# Patient Record
Sex: Male | Born: 1960 | Race: White | Hispanic: No | Marital: Married | Smoking: Current every day smoker
Health system: Southern US, Community
[De-identification: ages and names within clinical notes are randomized; demographics above are authoritative.]

## PROBLEM LIST (undated history)

## (undated) DIAGNOSIS — E119 Type 2 diabetes mellitus without complications: Secondary | ICD-10-CM

## (undated) DIAGNOSIS — F419 Anxiety disorder, unspecified: Secondary | ICD-10-CM

## (undated) DIAGNOSIS — G4730 Sleep apnea, unspecified: Secondary | ICD-10-CM

## (undated) DIAGNOSIS — G4700 Insomnia, unspecified: Secondary | ICD-10-CM

## (undated) DIAGNOSIS — K219 Gastro-esophageal reflux disease without esophagitis: Secondary | ICD-10-CM

## (undated) DIAGNOSIS — J449 Chronic obstructive pulmonary disease, unspecified: Secondary | ICD-10-CM

## (undated) DIAGNOSIS — I10 Essential (primary) hypertension: Secondary | ICD-10-CM

## (undated) DIAGNOSIS — E785 Hyperlipidemia, unspecified: Secondary | ICD-10-CM

## (undated) DIAGNOSIS — G894 Chronic pain syndrome: Secondary | ICD-10-CM

## (undated) DIAGNOSIS — R188 Other ascites: Secondary | ICD-10-CM

## (undated) DIAGNOSIS — I219 Acute myocardial infarction, unspecified: Secondary | ICD-10-CM

## (undated) DIAGNOSIS — N183 Chronic kidney disease, stage 3 unspecified: Secondary | ICD-10-CM

## (undated) HISTORY — DX: Chronic pain syndrome: G89.4

## (undated) HISTORY — DX: Chronic kidney disease, stage 3 unspecified: N18.30

## (undated) HISTORY — PX: BACK SURGERY: SHX140

## (undated) HISTORY — DX: Gastro-esophageal reflux disease without esophagitis: K21.9

## (undated) HISTORY — DX: Chronic obstructive pulmonary disease, unspecified: J44.9

## (undated) HISTORY — DX: Acute myocardial infarction, unspecified: I21.9

## (undated) HISTORY — DX: Anxiety disorder, unspecified: F41.9

## (undated) HISTORY — DX: Hyperlipidemia, unspecified: E78.5

## (undated) HISTORY — PX: CARDIAC CATHETERIZATION: SHX172

## (undated) HISTORY — DX: Essential (primary) hypertension: I10

## (undated) HISTORY — PX: APPENDECTOMY: SHX54

## (undated) HISTORY — PX: CHOLECYSTECTOMY: SHX55

## (undated) HISTORY — DX: Other ascites: R18.8

## (undated) HISTORY — DX: Insomnia, unspecified: G47.00

---

## 2008-11-15 ENCOUNTER — Emergency Department (HOSPITAL_COMMUNITY): Admission: EM | Admit: 2008-11-15 | Discharge: 2008-11-15 | Payer: Self-pay | Admitting: Emergency Medicine

## 2009-01-22 ENCOUNTER — Emergency Department (HOSPITAL_COMMUNITY): Admission: EM | Admit: 2009-01-22 | Discharge: 2009-01-22 | Payer: Self-pay | Admitting: Emergency Medicine

## 2019-02-19 LAB — HEMOGLOBIN A1C: Hemoglobin A1C: 8.8

## 2019-09-22 DIAGNOSIS — F1021 Alcohol dependence, in remission: Secondary | ICD-10-CM

## 2019-09-22 DIAGNOSIS — J9621 Acute and chronic respiratory failure with hypoxia: Secondary | ICD-10-CM | POA: Diagnosis not present

## 2019-09-22 DIAGNOSIS — J690 Pneumonitis due to inhalation of food and vomit: Secondary | ICD-10-CM | POA: Diagnosis not present

## 2019-09-22 DIAGNOSIS — N179 Acute kidney failure, unspecified: Secondary | ICD-10-CM | POA: Diagnosis not present

## 2019-09-22 DIAGNOSIS — I469 Cardiac arrest, cause unspecified: Secondary | ICD-10-CM | POA: Diagnosis not present

## 2019-09-23 DIAGNOSIS — J690 Pneumonitis due to inhalation of food and vomit: Secondary | ICD-10-CM

## 2019-09-23 DIAGNOSIS — I469 Cardiac arrest, cause unspecified: Secondary | ICD-10-CM

## 2019-09-23 DIAGNOSIS — J9621 Acute and chronic respiratory failure with hypoxia: Secondary | ICD-10-CM

## 2019-09-23 DIAGNOSIS — F1021 Alcohol dependence, in remission: Secondary | ICD-10-CM

## 2019-09-23 DIAGNOSIS — N179 Acute kidney failure, unspecified: Secondary | ICD-10-CM

## 2019-09-24 DIAGNOSIS — N179 Acute kidney failure, unspecified: Secondary | ICD-10-CM

## 2019-09-24 DIAGNOSIS — J690 Pneumonitis due to inhalation of food and vomit: Secondary | ICD-10-CM

## 2019-09-24 DIAGNOSIS — F1021 Alcohol dependence, in remission: Secondary | ICD-10-CM

## 2019-09-24 DIAGNOSIS — J9621 Acute and chronic respiratory failure with hypoxia: Secondary | ICD-10-CM

## 2019-09-24 DIAGNOSIS — I469 Cardiac arrest, cause unspecified: Secondary | ICD-10-CM

## 2019-09-25 DIAGNOSIS — I469 Cardiac arrest, cause unspecified: Secondary | ICD-10-CM

## 2019-09-25 DIAGNOSIS — N179 Acute kidney failure, unspecified: Secondary | ICD-10-CM | POA: Diagnosis not present

## 2019-09-25 DIAGNOSIS — J9621 Acute and chronic respiratory failure with hypoxia: Secondary | ICD-10-CM

## 2019-09-25 DIAGNOSIS — F1021 Alcohol dependence, in remission: Secondary | ICD-10-CM

## 2019-09-25 DIAGNOSIS — J690 Pneumonitis due to inhalation of food and vomit: Secondary | ICD-10-CM | POA: Diagnosis not present

## 2019-09-26 DIAGNOSIS — F1021 Alcohol dependence, in remission: Secondary | ICD-10-CM

## 2019-09-26 DIAGNOSIS — J690 Pneumonitis due to inhalation of food and vomit: Secondary | ICD-10-CM

## 2019-09-26 DIAGNOSIS — I469 Cardiac arrest, cause unspecified: Secondary | ICD-10-CM

## 2019-09-26 DIAGNOSIS — N179 Acute kidney failure, unspecified: Secondary | ICD-10-CM

## 2019-09-26 DIAGNOSIS — J9621 Acute and chronic respiratory failure with hypoxia: Secondary | ICD-10-CM

## 2019-09-27 DIAGNOSIS — N179 Acute kidney failure, unspecified: Secondary | ICD-10-CM

## 2019-09-27 DIAGNOSIS — J690 Pneumonitis due to inhalation of food and vomit: Secondary | ICD-10-CM

## 2019-09-27 DIAGNOSIS — F1021 Alcohol dependence, in remission: Secondary | ICD-10-CM

## 2019-09-27 DIAGNOSIS — I469 Cardiac arrest, cause unspecified: Secondary | ICD-10-CM

## 2019-09-27 DIAGNOSIS — J9621 Acute and chronic respiratory failure with hypoxia: Secondary | ICD-10-CM

## 2019-09-28 DIAGNOSIS — J690 Pneumonitis due to inhalation of food and vomit: Secondary | ICD-10-CM

## 2019-09-28 DIAGNOSIS — F1021 Alcohol dependence, in remission: Secondary | ICD-10-CM

## 2019-09-28 DIAGNOSIS — I469 Cardiac arrest, cause unspecified: Secondary | ICD-10-CM

## 2019-09-28 DIAGNOSIS — N179 Acute kidney failure, unspecified: Secondary | ICD-10-CM

## 2019-09-28 DIAGNOSIS — J9621 Acute and chronic respiratory failure with hypoxia: Secondary | ICD-10-CM

## 2019-10-03 DIAGNOSIS — J9621 Acute and chronic respiratory failure with hypoxia: Secondary | ICD-10-CM

## 2019-10-03 DIAGNOSIS — F1021 Alcohol dependence, in remission: Secondary | ICD-10-CM

## 2019-10-03 DIAGNOSIS — J690 Pneumonitis due to inhalation of food and vomit: Secondary | ICD-10-CM

## 2019-10-03 DIAGNOSIS — I469 Cardiac arrest, cause unspecified: Secondary | ICD-10-CM

## 2019-10-03 DIAGNOSIS — N179 Acute kidney failure, unspecified: Secondary | ICD-10-CM

## 2019-10-04 DIAGNOSIS — F1021 Alcohol dependence, in remission: Secondary | ICD-10-CM

## 2019-10-04 DIAGNOSIS — I468 Cardiac arrest due to other underlying condition: Secondary | ICD-10-CM

## 2019-10-04 DIAGNOSIS — J9621 Acute and chronic respiratory failure with hypoxia: Secondary | ICD-10-CM

## 2019-10-04 DIAGNOSIS — N179 Acute kidney failure, unspecified: Secondary | ICD-10-CM

## 2019-10-04 DIAGNOSIS — J690 Pneumonitis due to inhalation of food and vomit: Secondary | ICD-10-CM

## 2019-10-05 DIAGNOSIS — J690 Pneumonitis due to inhalation of food and vomit: Secondary | ICD-10-CM

## 2019-10-05 DIAGNOSIS — N179 Acute kidney failure, unspecified: Secondary | ICD-10-CM

## 2019-10-05 DIAGNOSIS — F1021 Alcohol dependence, in remission: Secondary | ICD-10-CM

## 2019-10-05 DIAGNOSIS — J9621 Acute and chronic respiratory failure with hypoxia: Secondary | ICD-10-CM

## 2019-10-05 DIAGNOSIS — I469 Cardiac arrest, cause unspecified: Secondary | ICD-10-CM

## 2019-10-06 DIAGNOSIS — F1021 Alcohol dependence, in remission: Secondary | ICD-10-CM

## 2019-10-06 DIAGNOSIS — N179 Acute kidney failure, unspecified: Secondary | ICD-10-CM

## 2019-10-06 DIAGNOSIS — J690 Pneumonitis due to inhalation of food and vomit: Secondary | ICD-10-CM

## 2019-10-06 DIAGNOSIS — J9621 Acute and chronic respiratory failure with hypoxia: Secondary | ICD-10-CM

## 2019-10-06 DIAGNOSIS — I469 Cardiac arrest, cause unspecified: Secondary | ICD-10-CM

## 2019-10-07 DIAGNOSIS — J9621 Acute and chronic respiratory failure with hypoxia: Secondary | ICD-10-CM

## 2019-10-07 DIAGNOSIS — I469 Cardiac arrest, cause unspecified: Secondary | ICD-10-CM

## 2019-10-07 DIAGNOSIS — F1021 Alcohol dependence, in remission: Secondary | ICD-10-CM

## 2019-10-07 DIAGNOSIS — J690 Pneumonitis due to inhalation of food and vomit: Secondary | ICD-10-CM

## 2019-10-07 DIAGNOSIS — N179 Acute kidney failure, unspecified: Secondary | ICD-10-CM

## 2019-10-08 DIAGNOSIS — I469 Cardiac arrest, cause unspecified: Secondary | ICD-10-CM

## 2019-10-08 DIAGNOSIS — J9621 Acute and chronic respiratory failure with hypoxia: Secondary | ICD-10-CM

## 2019-10-08 DIAGNOSIS — F1021 Alcohol dependence, in remission: Secondary | ICD-10-CM

## 2019-10-08 DIAGNOSIS — J690 Pneumonitis due to inhalation of food and vomit: Secondary | ICD-10-CM

## 2019-10-08 DIAGNOSIS — N179 Acute kidney failure, unspecified: Secondary | ICD-10-CM

## 2019-10-09 DIAGNOSIS — J9621 Acute and chronic respiratory failure with hypoxia: Secondary | ICD-10-CM

## 2019-10-09 DIAGNOSIS — J690 Pneumonitis due to inhalation of food and vomit: Secondary | ICD-10-CM

## 2019-10-09 DIAGNOSIS — I469 Cardiac arrest, cause unspecified: Secondary | ICD-10-CM

## 2019-10-09 DIAGNOSIS — F1021 Alcohol dependence, in remission: Secondary | ICD-10-CM

## 2019-10-09 DIAGNOSIS — N179 Acute kidney failure, unspecified: Secondary | ICD-10-CM

## 2019-10-10 DIAGNOSIS — J9621 Acute and chronic respiratory failure with hypoxia: Secondary | ICD-10-CM

## 2019-10-10 DIAGNOSIS — N179 Acute kidney failure, unspecified: Secondary | ICD-10-CM

## 2019-10-10 DIAGNOSIS — J690 Pneumonitis due to inhalation of food and vomit: Secondary | ICD-10-CM

## 2019-10-10 DIAGNOSIS — F1021 Alcohol dependence, in remission: Secondary | ICD-10-CM

## 2019-10-10 DIAGNOSIS — I469 Cardiac arrest, cause unspecified: Secondary | ICD-10-CM

## 2019-10-11 DIAGNOSIS — I469 Cardiac arrest, cause unspecified: Secondary | ICD-10-CM

## 2019-10-11 DIAGNOSIS — F1021 Alcohol dependence, in remission: Secondary | ICD-10-CM

## 2019-10-11 DIAGNOSIS — J9621 Acute and chronic respiratory failure with hypoxia: Secondary | ICD-10-CM

## 2019-10-11 DIAGNOSIS — J690 Pneumonitis due to inhalation of food and vomit: Secondary | ICD-10-CM

## 2019-10-11 DIAGNOSIS — N179 Acute kidney failure, unspecified: Secondary | ICD-10-CM

## 2019-10-12 DIAGNOSIS — N179 Acute kidney failure, unspecified: Secondary | ICD-10-CM

## 2019-10-12 DIAGNOSIS — J9621 Acute and chronic respiratory failure with hypoxia: Secondary | ICD-10-CM

## 2019-10-12 DIAGNOSIS — J690 Pneumonitis due to inhalation of food and vomit: Secondary | ICD-10-CM

## 2019-10-12 DIAGNOSIS — F1021 Alcohol dependence, in remission: Secondary | ICD-10-CM

## 2019-10-12 DIAGNOSIS — I469 Cardiac arrest, cause unspecified: Secondary | ICD-10-CM

## 2019-10-16 ENCOUNTER — Encounter: Payer: Self-pay | Admitting: "Endocrinology

## 2019-10-20 DIAGNOSIS — N179 Acute kidney failure, unspecified: Secondary | ICD-10-CM

## 2019-10-20 DIAGNOSIS — F1021 Alcohol dependence, in remission: Secondary | ICD-10-CM

## 2019-10-20 DIAGNOSIS — I469 Cardiac arrest, cause unspecified: Secondary | ICD-10-CM

## 2019-10-20 DIAGNOSIS — J690 Pneumonitis due to inhalation of food and vomit: Secondary | ICD-10-CM

## 2019-10-20 DIAGNOSIS — J9621 Acute and chronic respiratory failure with hypoxia: Secondary | ICD-10-CM

## 2019-10-21 DIAGNOSIS — I469 Cardiac arrest, cause unspecified: Secondary | ICD-10-CM | POA: Diagnosis not present

## 2019-10-21 DIAGNOSIS — N179 Acute kidney failure, unspecified: Secondary | ICD-10-CM

## 2019-10-21 DIAGNOSIS — J690 Pneumonitis due to inhalation of food and vomit: Secondary | ICD-10-CM

## 2019-10-21 DIAGNOSIS — J9621 Acute and chronic respiratory failure with hypoxia: Secondary | ICD-10-CM

## 2019-10-21 DIAGNOSIS — F1021 Alcohol dependence, in remission: Secondary | ICD-10-CM

## 2019-11-18 ENCOUNTER — Encounter: Payer: Self-pay | Admitting: Physician Assistant

## 2019-12-03 ENCOUNTER — Encounter: Payer: Self-pay | Admitting: Physician Assistant

## 2019-12-03 ENCOUNTER — Other Ambulatory Visit (INDEPENDENT_AMBULATORY_CARE_PROVIDER_SITE_OTHER): Payer: Medicare Other

## 2019-12-03 ENCOUNTER — Telehealth: Payer: Self-pay | Admitting: General Surgery

## 2019-12-03 ENCOUNTER — Ambulatory Visit (INDEPENDENT_AMBULATORY_CARE_PROVIDER_SITE_OTHER): Payer: Medicare Other | Admitting: Physician Assistant

## 2019-12-03 VITALS — BP 92/66 | HR 70 | Temp 97.8°F | Ht 69.0 in | Wt 200.0 lb

## 2019-12-03 DIAGNOSIS — R1084 Generalized abdominal pain: Secondary | ICD-10-CM

## 2019-12-03 DIAGNOSIS — I8500 Esophageal varices without bleeding: Secondary | ICD-10-CM

## 2019-12-03 DIAGNOSIS — K7031 Alcoholic cirrhosis of liver with ascites: Secondary | ICD-10-CM

## 2019-12-03 DIAGNOSIS — K766 Portal hypertension: Secondary | ICD-10-CM

## 2019-12-03 LAB — COMPREHENSIVE METABOLIC PANEL
ALT: 28 U/L (ref 0–53)
AST: 29 U/L (ref 0–37)
Albumin: 4.7 g/dL (ref 3.5–5.2)
Alkaline Phosphatase: 83 U/L (ref 39–117)
BUN: 17 mg/dL (ref 6–23)
CO2: 28 mEq/L (ref 19–32)
Calcium: 10.2 mg/dL (ref 8.4–10.5)
Chloride: 94 mEq/L — ABNORMAL LOW (ref 96–112)
Creatinine, Ser: 1.39 mg/dL (ref 0.40–1.50)
GFR: 52.29 mL/min — ABNORMAL LOW (ref >60.00)
Glucose, Bld: 427 mg/dL — ABNORMAL HIGH (ref 70–99)
Potassium: 3.9 mEq/L (ref 3.5–5.1)
Sodium: 133 mEq/L — ABNORMAL LOW (ref 135–145)
Total Bilirubin: 1.2 mg/dL (ref 0.2–1.2)
Total Protein: 8.5 g/dL — ABNORMAL HIGH (ref 6.0–8.3)

## 2019-12-03 LAB — CBC WITH DIFFERENTIAL/PLATELET
Basophils Absolute: 0.1 10*3/uL (ref 0.0–0.1)
Basophils Relative: 0.9 % (ref 0.0–3.0)
Eosinophils Absolute: 0.3 10*3/uL (ref 0.0–0.7)
Eosinophils Relative: 3.7 % (ref 0.0–5.0)
HCT: 41.9 % (ref 39.0–52.0)
Hemoglobin: 13.9 g/dL (ref 13.0–17.0)
Lymphocytes Relative: 22.3 % (ref 12.0–46.0)
Lymphs Abs: 1.8 10*3/uL (ref 0.7–4.0)
MCHC: 33.1 g/dL (ref 30.0–36.0)
MCV: 84.9 fl (ref 78.0–100.0)
Monocytes Absolute: 0.4 10*3/uL (ref 0.1–1.0)
Monocytes Relative: 4.7 % (ref 3.0–12.0)
Neutro Abs: 5.4 10*3/uL (ref 1.4–7.7)
Neutrophils Relative %: 68.4 % (ref 43.0–77.0)
Platelets: 138 10*3/uL — ABNORMAL LOW (ref 150.0–400.0)
RBC: 4.94 Mil/uL (ref 4.22–5.81)
RDW: 16.3 % — ABNORMAL HIGH (ref 11.5–15.5)
WBC: 8 10*3/uL (ref 4.0–10.5)

## 2019-12-03 LAB — PROTIME-INR
INR: 1.1 ratio — ABNORMAL HIGH (ref 0.8–1.0)
Prothrombin Time: 12.7 s (ref 9.6–13.1)

## 2019-12-03 NOTE — Addendum Note (Signed)
Addended by: Latricia Heft A on: 12/03/2019 12:19 PM   Modules accepted: Orders

## 2019-12-03 NOTE — Progress Notes (Signed)
Chief Complaint: Cirrhosis with ascites   HPI:    Mr. Spencer Fletcher is a 59 year old male with a past medical history as listed below, who was referred to me by Sherrilee Gilles, DO for a complaint of cirrhosis with ascites and abdominal pain.      10/27/2019 patient seen by PCP in [place].  It was noted that he had been seen previously 09/04/2019 with a complaint of shortness of breath and chills, he had a cardiac arrest in the emergency room and then achieved spontaneous circulation within less than a minute.  He was intubated and transferred to the ICU.  They did a CT scan of the chest abdomen pelvis which showed cirrhosis with portal hypertension and moderate ascites.  He was started on broad-spectrum antibiotics for possible aspiration pneumonia and paracentesis was done twice.  He had another cardiac arrest during his stay in the ICU.  They were able to wean him from the vent and they transferred him to [facility] on 09/17/2019.  He underwent PEG and trach placement.  He was then decannulated on 10/17/2019 and was on 2 L of oxygen.  He did have blood cultures x2 that grew out Paryimonas micra and he was placed on cefazolin until 10/10/2019.  Is recommended he have a repeat CT scan of the abdomen pelvis with contrast following discharge to look for possible malignancy due to his organism being associated with GI malignancy.  Was also recommended he have a repeat paracentesis while he was at [facility].  He did have hepatic encephalopathy during his stay and he was on lactulose.  At that time he was following up.  He had just been discharged from [facility] 5 days ago.  He was on a fentanyl patch for chronic pain.    10/28/2019 CT of the abdomen pelvis with contrast done for cirrhosis with ascites.  Findings included cirrhosis with sequela of portal hypertension, trace/small volume ascites, moderate to large colonic stool burden may be reflective of constipation.  Spleen was enlarged measuring 14.9 cm.  Liver contour is  nodular with caudate lobe hypertrophy.  Small volume perihepatic free fluid extended into the right paracolic gutter.  Several scattered hypodensities throughout the liver are too small to characterize.    Today, the patient and his wife present to clinic and explain that the patient has been through a lot recently.  He was diagnosed with alcoholic cirrhosis during a hospitalization during which he underwent respiratory arrest and then cardiac arrest as above.  They explained that he had a CT in January which showed varices and signs of portal hypertension as well as a nodular liver.  He has had 3 paracentesis the first 2 were 3 L and the last was 1 L at the end of February.  He is currently on Reglan 3 times daily which apparently he has been on for years to assist with reflux control, Pantoprazole 40 every morning, Lasix 40 twice daily, Spironolactone 25 mg every morning and Lactulose 60 mL twice daily as well as Xifaxan 550 twice daily and potassium 20 mg.    Patient's largest complaint today is of abdominal pain which he describes as a dull cramp rated as a 6-7/10 at its least and a 10/10 at its most.  Seems to fluctuate in intensity throughout the day and is mostly on the lower part of his abdomen as well as the right side.  He tells me it never goes away and keeps him awake and also wakes him up from his  sleep.  It has been going on a couple of months ever since all of this happened.  Nothing seems to help but even his Oxycodone which he is on for chronic pain.    Patient and wife describe an EGD during which a PEG tube was placed during hospitalization.  Apparently the patient had such bad varices that they were scared they would not be able to get down.  He was told he would need another one in the future.  Describes his last colonoscopy 10 years ago.    Denies fever, chills, weight loss or continued alcohol use.  Current Outpatient Medications  Medication Sig Dispense Refill  . buPROPion (WELLBUTRIN  XL) 300 MG 24 hr tablet Take 1 tablet by mouth daily.    . citalopram (CELEXA) 10 MG tablet Take 1 tablet by mouth at bedtime.    . clonazePAM (KLONOPIN) 0.5 MG tablet Take 1 tablet by mouth at bedtime as needed.    . Docusate Sodium (DSS) 100 MG CAPS Take 1 capsule by mouth daily.    . fentaNYL (DURAGESIC) 12 MCG/HR Place onto the skin as directed.    . folic acid (FOLVITE) 1 MG tablet Take 1 mg by mouth daily.    . furosemide (LASIX) 40 MG tablet Take 1 tablet by mouth 2 (two) times daily.    . insulin glargine, 1 Unit Dial, (TOUJEO SOLOSTAR) 300 UNIT/ML Solostar Pen Inject 27 Units into the skin 2 (two) times daily.    . lactulose (CHRONULAC) 10 GM/15ML solution Take by mouth as directed.    . metoCLOPramide (REGLAN) 10 MG tablet Take 1 tablet by mouth daily.    . oxycodone (OXY-IR) 5 MG capsule Take 5 mg by mouth every 4 (four) hours as needed.    . potassium chloride SA (KLOR-CON M20) 20 MEQ tablet Take 1 tablet by mouth daily.    . pregabalin (LYRICA) 200 MG capsule Take 1 capsule by mouth in the morning, at noon, and at bedtime.    . rifaximin (XIFAXAN) 550 MG TABS tablet Take 550 mg by mouth 2 (two) times daily.    . spironolactone (ALDACTONE) 25 MG tablet Take 1 tablet by mouth daily.    . thiamine (VITAMIN B-1) 100 MG tablet Take 100 mg by mouth daily.     No current facility-administered medications for this visit.    Allergies as of 12/03/2019 - Review Complete 12/03/2019  Allergen Reaction Noted  . Ambien [zolpidem] Other (See Comments) 12/03/2019  . Carafate [sucralfate] Hives and Itching 12/03/2019    Family History  Problem Relation Age of Onset  . Breast cancer Mother   . Diabetes Father   . Heart disease Father     Social History   Socioeconomic History  . Marital status: Married    Spouse name: Not on file  . Number of children: Not on file  . Years of education: Not on file  . Highest education level: Not on file  Occupational History  . Not on file   Tobacco Use  . Smoking status: Former Smoker  . Smokeless tobacco: Never Used  Substance and Sexual Activity  . Alcohol use: Not Currently  . Drug use: Not Currently  . Sexual activity: Not on file  Other Topics Concern  . Not on file  Social History Narrative  . Not on file   Social Determinants of Health   Financial Resource Strain:   . Difficulty of Paying Living Expenses:   Food Insecurity:   . Worried   About Running Out of Food in the Last Year:   . Ran Out of Food in the Last Year:   Transportation Needs:   . Lack of Transportation (Medical):   . Lack of Transportation (Non-Medical):   Physical Activity:   . Days of Exercise per Week:   . Minutes of Exercise per Session:   Stress:   . Feeling of Stress :   Social Connections:   . Frequency of Communication with Friends and Family:   . Frequency of Social Gatherings with Friends and Family:   . Attends Religious Services:   . Active Member of Clubs or Organizations:   . Attends Club or Organization Meetings:   . Marital Status:   Intimate Partner Violence:   . Fear of Current or Ex-Partner:   . Emotionally Abused:   . Physically Abused:   . Sexually Abused:     Review of Systems:    Constitutional: No weight loss, fever or chills Skin: No rash  Cardiovascular: No chest pain  Respiratory: No SOB  Gastrointestinal: See HPI and otherwise negative Genitourinary: No dysuria  Neurological: +dizziness Musculoskeletal: No new muscle or joint pain Hematologic: No bleeding  Psychiatric: No history of depression or anxiety   Physical Exam:  Vital signs: BP 92/66   Pulse 70   Temp 97.8 F (36.6 C)   Ht 5' 9" (1.753 m)   Wt 200 lb (90.7 kg)   BMI 29.53 kg/m   Constitutional:   Pleasant Caucasian male appears to be in NAD, Well developed, Well nourished, alert and cooperative Head:  Normocephalic and atraumatic. Eyes:   PEERL, EOMI. No icterus. Conjunctiva pink. Ears:  Normal auditory acuity. Neck:   Supple Throat: Oral cavity and pharynx without inflammation, swelling or lesion.  Respiratory: Respirations even and unlabored. Lungs clear to auscultation bilaterally.   No wheezes, crackles, or rhonchi.  Cardiovascular: Normal S1, S2. No MRG. Regular rate and rhythm. No peripheral edema, cyanosis or pallor.  Gastrointestinal:  Soft, mild distension, moderate ttp in RLQ and suprapubic region, No rebound or guarding. Normal bowel sounds. No appreciable masses or hepatomegaly. Rectal:  Not performed.  Msk:  Symmetrical without gross deformities. Without edema, no deformity or joint abnormality.  Neurologic:  Alert and  oriented x4;  grossly normal neurologically.  Skin:   Dry and intact without significant lesions or rashes. Psychiatric: Demonstrates good judgement and reason without abnormal affect or behaviors.  Assessment: 1.  Decompensated cirrhosis with portal hypertension, esophageal varices, ascites and hepatic encephalopathy: Discovered during recent hospitalization in January during which patient underwent respiratory and cardiac arrest, he was intubated as well as had a PEG tube placed during that hospitalization, currently symptoms are somewhat well managed on his current medication regimen but he does have abdominal pain; consider SBP versus other 2.  Abdominal pain: Concern for SBP  Plan: 1.  Ordered labs including CBC, CMP and PT/INR 2.  Scheduled patient for MRI of the abdomen with liver protocol for further evaluation of abnormal CT. 3.  Patient will be scheduled for an EGD with likely variceal banding as well as screening colonoscopy in the hospital.  This will be scheduled with Dr. Jacobs as he has availability soon.  Did discuss risks, benefits, limitations and alternatives and the patient agrees to proceed.  He will be Covid tested 2 days prior to time of procedure. 4.  Continue current medications. 5.  Patient was scheduled for diagnostic paracentesis with fluid sent for cell  count and differential, cytology, Gram stain,   culture and protein. 6.  Patient will need to follow-up with us in a month.  Dr. Jacobs will be his primary physician.  Samanthamarie Ezzell, PA-C  Gastroenterology 12/03/2019, 9:28 AM  Cc: McGee, Rachel, DO  

## 2019-12-03 NOTE — Patient Instructions (Addendum)
48If you are age 59 or older, your body mass index should be between 23-30. Your Body mass index is 29.53 kg/m. If this is out of the aforementioned range listed, please consider follow up with your Primary Care Provider.  If you are age 60 or younger, your body mass index should be between 19-25. Your Body mass index is 29.53 kg/m. If this is out of the aformentioned range listed, please consider follow up with your Primary Care Provider.   Your provider has requested that you go to the basement level for lab work before leaving today. Press "B" on the elevator. The lab is located at the first door on the left as you exit the elevator.  Continue on current medication.  You have been scheduled for an MRI at [facility] on 12/15/2019. Your appointment time is 8:00 am. Please arrive 15 minutes prior to your appointment time for registration purposes. Please make certain not to have anything to eat or drink 6 hours prior to your test. In addition, if you have any metal in your body, have a pacemaker or defibrillator, please be sure to let your ordering physician know. This test typically takes 45 minutes to 1 hour to complete. Should you need to reschedule, please call [phone] to do so.  You have been scheduled for an abdominal paracentesis at [facility] radiology (1st floor of hospital) on 12/04/2019 at 10:45 am. Please arrive at least 15 minutes prior to your appointment time for registration. Should you need to reschedule this appointment for any reason, please call our office at [phone].   Due to recent changes in healthcare laws, you may see the results of your imaging and laboratory studies on MyChart before your provider has had a chance to review them.  We understand that in some cases there may be results that are confusing or concerning to you. Not all laboratory results come back in the same time frame and the provider may be waiting for multiple results in order to interpret others.   Please give [place] 48 hours in order for your provider to thoroughly review all the results before contacting the office for clarification of your results.   Follow up in 1 month  Thank you for choosing me and [facility] Hyacinth Meeker, PA-C

## 2019-12-03 NOTE — Telephone Encounter (Signed)
Contacted the patient back, I was mistaken about the date of his procedure it was to be scheduled 12/18/2019 per Jennifer/Dr Christella Hartigan message. Rescheduled with Noreene Larsson for 12/18/2019. Patient called and verbalized understanding. He will pick up his prep and directions at [facility] office on 12/04/2019,

## 2019-12-03 NOTE — Telephone Encounter (Signed)
Contacted the patient and advised him of his procedure and Covid testing. The patient would like his Covid test moved to [facility]. Also gave him a sample of Plenvue. The patient will come to the office on 12/04/2019 to pick up the instructions and plenvue.

## 2019-12-03 NOTE — H&P (View-Only) (Signed)
Chief Complaint: Cirrhosis with ascites   HPI:    Spencer Fletcher is a 59 year old male with a past medical history as listed below, who was referred to me by Sherrilee Gilles, DO for a complaint of cirrhosis with ascites and abdominal pain.      10/27/2019 patient seen by PCP in [place].  It was noted that he had been seen previously 09/04/2019 with a complaint of shortness of breath and chills, he had a cardiac arrest in the emergency room and then achieved spontaneous circulation within less than a minute.  He was intubated and transferred to the ICU.  They did a CT scan of the chest abdomen pelvis which showed cirrhosis with portal hypertension and moderate ascites.  He was started on broad-spectrum antibiotics for possible aspiration pneumonia and paracentesis was done twice.  He had another cardiac arrest during his stay in the ICU.  They were able to wean him from the vent and they transferred him to [facility] on 09/17/2019.  He underwent PEG and trach placement.  He was then decannulated on 10/17/2019 and was on 2 L of oxygen.  He did have blood cultures x2 that grew out Paryimonas micra and he was placed on cefazolin until 10/10/2019.  Is recommended he have a repeat CT scan of the abdomen pelvis with contrast following discharge to look for possible malignancy due to his organism being associated with GI malignancy.  Was also recommended he have a repeat paracentesis while he was at [facility].  He did have hepatic encephalopathy during his stay and he was on lactulose.  At that time he was following up.  He had just been discharged from [facility] 5 days ago.  He was on a fentanyl patch for chronic pain.    10/28/2019 CT of the abdomen pelvis with contrast done for cirrhosis with ascites.  Findings included cirrhosis with sequela of portal hypertension, trace/small volume ascites, moderate to large colonic stool burden may be reflective of constipation.  Spleen was enlarged measuring 14.9 cm.  Liver contour is  nodular with caudate lobe hypertrophy.  Small volume perihepatic free fluid extended into the right paracolic gutter.  Several scattered hypodensities throughout the liver are too small to characterize.    Today, the patient and his wife present to clinic and explain that the patient has been through a lot recently.  He was diagnosed with alcoholic cirrhosis during a hospitalization during which he underwent respiratory arrest and then cardiac arrest as above.  They explained that he had a CT in January which showed varices and signs of portal hypertension as well as a nodular liver.  He has had 3 paracentesis the first 2 were 3 L and the last was 1 L at the end of February.  He is currently on Reglan 3 times daily which apparently he has been on for years to assist with reflux control, Pantoprazole 40 every morning, Lasix 40 twice daily, Spironolactone 25 mg every morning and Lactulose 60 mL twice daily as well as Xifaxan 550 twice daily and potassium 20 mg.    Patient's largest complaint today is of abdominal pain which he describes as a dull cramp rated as a 6-7/10 at its least and a 10/10 at its most.  Seems to fluctuate in intensity throughout the day and is mostly on the lower part of his abdomen as well as the right side.  He tells me it never goes away and keeps him awake and also wakes him up from his  sleep.  It has been going on a couple of months ever since all of this happened.  Nothing seems to help but even his Oxycodone which he is on for chronic pain.    Patient and wife describe an EGD during which a PEG tube was placed during hospitalization.  Apparently the patient had such bad varices that they were scared they would not be able to get down.  He was told he would need another one in the future.  Describes his last colonoscopy 10 years ago.    Denies fever, chills, weight loss or continued alcohol use.  Current Outpatient Medications  Medication Sig Dispense Refill  . buPROPion (WELLBUTRIN  XL) 300 MG 24 hr tablet Take 1 tablet by mouth daily.    . citalopram (CELEXA) 10 MG tablet Take 1 tablet by mouth at bedtime.    . clonazePAM (KLONOPIN) 0.5 MG tablet Take 1 tablet by mouth at bedtime as needed.    Tery Sanfilippo Sodium (DSS) 100 MG CAPS Take 1 capsule by mouth daily.    . fentaNYL (DURAGESIC) 12 MCG/HR Place onto the skin as directed.    . folic acid (FOLVITE) 1 MG tablet Take 1 mg by mouth daily.    . furosemide (LASIX) 40 MG tablet Take 1 tablet by mouth 2 (two) times daily.    . insulin glargine, 1 Unit Dial, (TOUJEO SOLOSTAR) 300 UNIT/ML Solostar Pen Inject 27 Units into the skin 2 (two) times daily.    Marland Kitchen lactulose (CHRONULAC) 10 GM/15ML solution Take by mouth as directed.    . metoCLOPramide (REGLAN) 10 MG tablet Take 1 tablet by mouth daily.    Marland Kitchen oxycodone (OXY-IR) 5 MG capsule Take 5 mg by mouth every 4 (four) hours as needed.    . potassium chloride SA (KLOR-CON M20) 20 MEQ tablet Take 1 tablet by mouth daily.    . pregabalin (LYRICA) 200 MG capsule Take 1 capsule by mouth in the morning, at noon, and at bedtime.    . rifaximin (XIFAXAN) 550 MG TABS tablet Take 550 mg by mouth 2 (two) times daily.    Marland Kitchen spironolactone (ALDACTONE) 25 MG tablet Take 1 tablet by mouth daily.    Marland Kitchen thiamine (VITAMIN B-1) 100 MG tablet Take 100 mg by mouth daily.     No current facility-administered medications for this visit.    Allergies as of 12/03/2019 - Review Complete 12/03/2019  Allergen Reaction Noted  . Ambien [zolpidem] Other (See Comments) 12/03/2019  . Carafate [sucralfate] Hives and Itching 12/03/2019    Family History  Problem Relation Age of Onset  . Breast cancer Mother   . Diabetes Father   . Heart disease Father     Social History   Socioeconomic History  . Marital status: Married    Spouse name: Not on file  . Number of children: Not on file  . Years of education: Not on file  . Highest education level: Not on file  Occupational History  . Not on file   Tobacco Use  . Smoking status: Former Games developer  . Smokeless tobacco: Never Used  Substance and Sexual Activity  . Alcohol use: Not Currently  . Drug use: Not Currently  . Sexual activity: Not on file  Other Topics Concern  . Not on file  Social History Narrative  . Not on file   Social Determinants of Health   Financial Resource Strain:   . Difficulty of Paying Living Expenses:   Food Insecurity:   . Worried  About Running Out of Food in the Last Year:   . Ran Out of Food in the Last Year:   Transportation Needs:   . Lack of Transportation (Medical):   Marland Kitchen Lack of Transportation (Non-Medical):   Physical Activity:   . Days of Exercise per Week:   . Minutes of Exercise per Session:   Stress:   . Feeling of Stress :   Social Connections:   . Frequency of Communication with Friends and Family:   . Frequency of Social Gatherings with Friends and Family:   . Attends Religious Services:   . Active Member of Clubs or Organizations:   . Attends Banker Meetings:   Marland Kitchen Marital Status:   Intimate Partner Violence:   . Fear of Current or Ex-Partner:   . Emotionally Abused:   Marland Kitchen Physically Abused:   . Sexually Abused:     Review of Systems:    Constitutional: No weight loss, fever or chills Skin: No rash  Cardiovascular: No chest pain  Respiratory: No SOB  Gastrointestinal: See HPI and otherwise negative Genitourinary: No dysuria  Neurological: +dizziness Musculoskeletal: No new muscle or joint pain Hematologic: No bleeding  Psychiatric: No history of depression or anxiety   Physical Exam:  Vital signs: BP 92/66   Pulse 70   Temp 97.8 F (36.6 C)   Ht 5\' 9"  (1.753 m)   Wt 200 lb (90.7 kg)   BMI 29.53 kg/m   Constitutional:   Pleasant Caucasian male appears to be in NAD, Well developed, Well nourished, alert and cooperative Head:  Normocephalic and atraumatic. Eyes:   PEERL, EOMI. No icterus. Conjunctiva pink. Ears:  Normal auditory acuity. Neck:   Supple Throat: Oral cavity and pharynx without inflammation, swelling or lesion.  Respiratory: Respirations even and unlabored. Lungs clear to auscultation bilaterally.   No wheezes, crackles, or rhonchi.  Cardiovascular: Normal S1, S2. No MRG. Regular rate and rhythm. No peripheral edema, cyanosis or pallor.  Gastrointestinal:  Soft, mild distension, moderate ttp in RLQ and suprapubic region, No rebound or guarding. Normal bowel sounds. No appreciable masses or hepatomegaly. Rectal:  Not performed.  Msk:  Symmetrical without gross deformities. Without edema, no deformity or joint abnormality.  Neurologic:  Alert and  oriented x4;  grossly normal neurologically.  Skin:   Dry and intact without significant lesions or rashes. Psychiatric: Demonstrates good judgement and reason without abnormal affect or behaviors.  Assessment: 1.  Decompensated cirrhosis with portal hypertension, esophageal varices, ascites and hepatic encephalopathy: Discovered during recent hospitalization in January during which patient underwent respiratory and cardiac arrest, he was intubated as well as had a PEG tube placed during that hospitalization, currently symptoms are somewhat well managed on his current medication regimen but he does have abdominal pain; consider SBP versus other 2.  Abdominal pain: Concern for SBP  Plan: 1.  Ordered labs including CBC, CMP and PT/INR 2.  Scheduled patient for MRI of the abdomen with liver protocol for further evaluation of abnormal CT. 3.  Patient will be scheduled for an EGD with likely variceal banding as well as screening colonoscopy in the hospital.  This will be scheduled with Dr. February as he has availability soon.  Did discuss risks, benefits, limitations and alternatives and the patient agrees to proceed.  He will be Covid tested 2 days prior to time of procedure. 4.  Continue current medications. 5.  Patient was scheduled for diagnostic paracentesis with fluid sent for cell  count and differential, cytology, Gram stain,  culture and protein. 6.  Patient will need to follow-up with [place] in a month.  Dr. Christella Hartigan will be his primary physician.  Hyacinth Meeker, PA-C [facility] 12/03/2019, 9:28 AM  Cc: Jonathon Bellows, DO

## 2019-12-03 NOTE — Addendum Note (Signed)
Addended by: Latricia Heft A on: 12/03/2019 11:48 AM   Modules accepted: Orders, SmartSet

## 2019-12-04 ENCOUNTER — Other Ambulatory Visit: Payer: Self-pay

## 2019-12-04 ENCOUNTER — Other Ambulatory Visit: Payer: Self-pay | Admitting: Physician Assistant

## 2019-12-04 ENCOUNTER — Telehealth: Payer: Self-pay

## 2019-12-04 ENCOUNTER — Ambulatory Visit (HOSPITAL_COMMUNITY)
Admission: RE | Admit: 2019-12-04 | Discharge: 2019-12-04 | Disposition: A | Discharge status: Home / Self Care | Payer: Medicare Other | Source: Ambulatory Visit | Attending: Physician Assistant | Admitting: Physician Assistant

## 2019-12-04 DIAGNOSIS — I8500 Esophageal varices without bleeding: Secondary | ICD-10-CM | POA: Diagnosis present

## 2019-12-04 DIAGNOSIS — K7031 Alcoholic cirrhosis of liver with ascites: Secondary | ICD-10-CM | POA: Insufficient documentation

## 2019-12-04 DIAGNOSIS — R1084 Generalized abdominal pain: Secondary | ICD-10-CM

## 2019-12-04 DIAGNOSIS — K766 Portal hypertension: Secondary | ICD-10-CM | POA: Diagnosis present

## 2019-12-04 NOTE — Progress Notes (Signed)
I agree with the above note, plan.  MELD-Na is 11 based on labs yesterday.  Not too bad actually.

## 2019-12-04 NOTE — Telephone Encounter (Signed)
12/11/19 [facility] 8 am NPO 4 hours pick up contrast at least 2 days prior. The pt has been advised and will call with any questions or concerns.

## 2019-12-04 NOTE — Telephone Encounter (Signed)
-----   Message from [facility] [place], [place] sent at 12/04/2019  1:32 PM EDT ----- Regarding: FW: IR couldn't do para Can you get repeat CT abdomen/pelvis with contrast (if able) on this gentleman. Thanks-JLL ----- Message ----- From: Rachael Fee, MD Sent: 12/04/2019  12:31 PM EDT To: Unk Lightning, PA Subject: FW: IR couldn't do para                          ----- Message ----- From: Unk Lightning, PA Sent: 12/04/2019  11:37 AM EDT To: Rachael Fee, MD Subject: IR couldn't do para                            IR couldn't do para-no fluid- think we need to repeat CT for this abdominal pain? Or just see what colo/EGD show?  Thanks-JLL ----- Message ----- From: Baird Lyons, PA-C Sent: 12/04/2019  11:34 AM EDT To: Unk Lightning, PA

## 2019-12-04 NOTE — Progress Notes (Signed)
I think CT would be helpful first.  Thanks

## 2019-12-04 NOTE — Progress Notes (Signed)
IR requested by Hyacinth Meeker, PA-C for possible image-guided paracentesis.  Limited abdominal US revealed no fluid that could be safely accessed with procedure. Images sent to Dr. Miles Costain for review. Informed patient that procedure will not occur today. All questions answered and concerns addressed. Will make Hyacinth Meeker, PA-C aware.  IR available in future if needed.   Waylan Boga Suly Vukelich, PA-C 12/04/2019, 11:34 AM

## 2019-12-08 ENCOUNTER — Other Ambulatory Visit (HOSPITAL_COMMUNITY)
Admission: RE | Admit: 2019-12-08 | Discharge: 2019-12-08 | Disposition: A | Discharge status: Home / Self Care | Payer: Medicare Other | Source: Ambulatory Visit | Attending: Physician Assistant | Admitting: Physician Assistant

## 2019-12-08 DIAGNOSIS — Z01812 Encounter for preprocedural laboratory examination: Secondary | ICD-10-CM | POA: Diagnosis present

## 2019-12-08 DIAGNOSIS — Z20822 Contact with and (suspected) exposure to covid-19: Secondary | ICD-10-CM | POA: Insufficient documentation

## 2019-12-08 LAB — SARS CORONAVIRUS 2 (TAT 6-24 HRS): SARS Coronavirus 2: NEGATIVE

## 2019-12-11 ENCOUNTER — Encounter (HOSPITAL_COMMUNITY): Payer: Self-pay

## 2019-12-11 ENCOUNTER — Other Ambulatory Visit: Payer: Self-pay

## 2019-12-11 ENCOUNTER — Ambulatory Visit (HOSPITAL_COMMUNITY)
Admission: RE | Admit: 2019-12-11 | Discharge: 2019-12-11 | Disposition: A | Discharge status: Home / Self Care | Payer: Medicare Other | Source: Ambulatory Visit | Attending: Physician Assistant | Admitting: Physician Assistant

## 2019-12-11 DIAGNOSIS — R1084 Generalized abdominal pain: Secondary | ICD-10-CM

## 2019-12-11 MED ORDER — IOHEXOL 9 MG/ML PO SOLN
1000.0000 mL | ORAL | Status: AC
  Administered 2019-12-11: 1000 mL via ORAL

## 2019-12-11 MED ORDER — IOHEXOL 300 MG/ML  SOLN
100.0000 mL | Freq: Once | INTRAMUSCULAR | Status: AC | PRN
  Administered 2019-12-11: 100 mL via INTRAVENOUS

## 2019-12-11 MED ORDER — IOHEXOL 9 MG/ML PO SOLN
ORAL | Status: AC
  Filled 2019-12-11: qty 1000

## 2019-12-11 MED ORDER — SODIUM CHLORIDE (PF) 0.9 % IJ SOLN
INTRAMUSCULAR | Status: AC
  Filled 2019-12-11: qty 50

## 2019-12-15 ENCOUNTER — Other Ambulatory Visit: Payer: Self-pay

## 2019-12-15 ENCOUNTER — Ambulatory Visit (HOSPITAL_COMMUNITY)
Admission: RE | Admit: 2019-12-15 | Discharge: 2019-12-15 | Disposition: A | Discharge status: Home / Self Care | Payer: Medicare Other | Source: Ambulatory Visit | Attending: Physician Assistant | Admitting: Physician Assistant

## 2019-12-15 ENCOUNTER — Other Ambulatory Visit (HOSPITAL_COMMUNITY): Payer: Medicare Other

## 2019-12-15 ENCOUNTER — Encounter (HOSPITAL_COMMUNITY): Payer: Self-pay

## 2019-12-15 ENCOUNTER — Other Ambulatory Visit: Payer: Self-pay | Admitting: Physician Assistant

## 2019-12-15 DIAGNOSIS — K766 Portal hypertension: Secondary | ICD-10-CM

## 2019-12-15 DIAGNOSIS — K7031 Alcoholic cirrhosis of liver with ascites: Secondary | ICD-10-CM

## 2019-12-15 DIAGNOSIS — R1084 Generalized abdominal pain: Secondary | ICD-10-CM

## 2019-12-15 DIAGNOSIS — I8500 Esophageal varices without bleeding: Secondary | ICD-10-CM

## 2019-12-17 ENCOUNTER — Other Ambulatory Visit: Payer: Self-pay | Admitting: Physician Assistant

## 2019-12-17 ENCOUNTER — Other Ambulatory Visit (HOSPITAL_COMMUNITY)
Admission: RE | Admit: 2019-12-17 | Discharge: 2019-12-17 | Disposition: A | Discharge status: Home / Self Care | Payer: Medicare Other | Source: Ambulatory Visit | Attending: Gastroenterology | Admitting: Gastroenterology

## 2019-12-17 ENCOUNTER — Ambulatory Visit (HOSPITAL_COMMUNITY)
Admission: RE | Admit: 2019-12-17 | Discharge: 2019-12-17 | Disposition: A | Discharge status: Home / Self Care | Payer: Medicare Other | Source: Ambulatory Visit | Attending: Physician Assistant | Admitting: Physician Assistant

## 2019-12-17 ENCOUNTER — Other Ambulatory Visit: Payer: Self-pay

## 2019-12-17 DIAGNOSIS — K7031 Alcoholic cirrhosis of liver with ascites: Secondary | ICD-10-CM | POA: Diagnosis present

## 2019-12-17 DIAGNOSIS — I8500 Esophageal varices without bleeding: Secondary | ICD-10-CM

## 2019-12-17 DIAGNOSIS — Z01812 Encounter for preprocedural laboratory examination: Secondary | ICD-10-CM | POA: Diagnosis not present

## 2019-12-17 DIAGNOSIS — Z20822 Contact with and (suspected) exposure to covid-19: Secondary | ICD-10-CM | POA: Insufficient documentation

## 2019-12-17 DIAGNOSIS — R1084 Generalized abdominal pain: Secondary | ICD-10-CM

## 2019-12-17 DIAGNOSIS — K766 Portal hypertension: Secondary | ICD-10-CM | POA: Diagnosis present

## 2019-12-17 MED ORDER — GADOBUTROL 1 MMOL/ML IV SOLN
9.0000 mL | Freq: Once | INTRAVENOUS | Status: AC | PRN
  Administered 2019-12-17: 12:00:00 9 mL via INTRAVENOUS

## 2019-12-18 ENCOUNTER — Encounter (HOSPITAL_COMMUNITY): Payer: Self-pay | Admitting: Gastroenterology

## 2019-12-18 ENCOUNTER — Encounter (HOSPITAL_COMMUNITY)
Admission: RE | Disposition: A | Discharge status: Home / Self Care | Payer: Self-pay | Source: Home / Self Care | Attending: Gastroenterology

## 2019-12-18 ENCOUNTER — Other Ambulatory Visit: Payer: Self-pay

## 2019-12-18 ENCOUNTER — Ambulatory Visit (HOSPITAL_COMMUNITY)
Admission: RE | Admit: 2019-12-18 | Discharge: 2019-12-18 | Disposition: A | Discharge status: Home / Self Care | Payer: Medicare Other | Attending: Gastroenterology | Admitting: Gastroenterology

## 2019-12-18 ENCOUNTER — Ambulatory Visit (HOSPITAL_COMMUNITY): Payer: Medicare Other | Admitting: Registered Nurse

## 2019-12-18 ENCOUNTER — Telehealth: Payer: Self-pay

## 2019-12-18 DIAGNOSIS — Z1211 Encounter for screening for malignant neoplasm of colon: Secondary | ICD-10-CM | POA: Diagnosis not present

## 2019-12-18 DIAGNOSIS — G8929 Other chronic pain: Secondary | ICD-10-CM | POA: Diagnosis not present

## 2019-12-18 DIAGNOSIS — K7290 Hepatic failure, unspecified without coma: Secondary | ICD-10-CM | POA: Insufficient documentation

## 2019-12-18 DIAGNOSIS — K3189 Other diseases of stomach and duodenum: Secondary | ICD-10-CM | POA: Diagnosis not present

## 2019-12-18 DIAGNOSIS — Z833 Family history of diabetes mellitus: Secondary | ICD-10-CM | POA: Diagnosis not present

## 2019-12-18 DIAGNOSIS — K5730 Diverticulosis of large intestine without perforation or abscess without bleeding: Secondary | ICD-10-CM | POA: Insufficient documentation

## 2019-12-18 DIAGNOSIS — K7031 Alcoholic cirrhosis of liver with ascites: Secondary | ICD-10-CM

## 2019-12-18 DIAGNOSIS — K219 Gastro-esophageal reflux disease without esophagitis: Secondary | ICD-10-CM | POA: Diagnosis not present

## 2019-12-18 DIAGNOSIS — Z87891 Personal history of nicotine dependence: Secondary | ICD-10-CM | POA: Insufficient documentation

## 2019-12-18 DIAGNOSIS — Z8674 Personal history of sudden cardiac arrest: Secondary | ICD-10-CM | POA: Diagnosis not present

## 2019-12-18 DIAGNOSIS — Z794 Long term (current) use of insulin: Secondary | ICD-10-CM | POA: Insufficient documentation

## 2019-12-18 DIAGNOSIS — R188 Other ascites: Secondary | ICD-10-CM | POA: Diagnosis not present

## 2019-12-18 DIAGNOSIS — E119 Type 2 diabetes mellitus without complications: Secondary | ICD-10-CM | POA: Diagnosis not present

## 2019-12-18 DIAGNOSIS — K2970 Gastritis, unspecified, without bleeding: Secondary | ICD-10-CM | POA: Diagnosis not present

## 2019-12-18 DIAGNOSIS — K766 Portal hypertension: Secondary | ICD-10-CM | POA: Diagnosis not present

## 2019-12-18 DIAGNOSIS — I8500 Esophageal varices without bleeding: Secondary | ICD-10-CM

## 2019-12-18 DIAGNOSIS — G4730 Sleep apnea, unspecified: Secondary | ICD-10-CM | POA: Insufficient documentation

## 2019-12-18 DIAGNOSIS — K2950 Unspecified chronic gastritis without bleeding: Secondary | ICD-10-CM | POA: Insufficient documentation

## 2019-12-18 DIAGNOSIS — R1084 Generalized abdominal pain: Secondary | ICD-10-CM

## 2019-12-18 DIAGNOSIS — I8510 Secondary esophageal varices without bleeding: Secondary | ICD-10-CM | POA: Diagnosis not present

## 2019-12-18 DIAGNOSIS — K7030 Alcoholic cirrhosis of liver without ascites: Secondary | ICD-10-CM | POA: Insufficient documentation

## 2019-12-18 HISTORY — DX: Type 2 diabetes mellitus without complications: E11.9

## 2019-12-18 HISTORY — PX: ESOPHAGOGASTRODUODENOSCOPY (EGD) WITH PROPOFOL: SHX5813

## 2019-12-18 HISTORY — DX: Sleep apnea, unspecified: G47.30

## 2019-12-18 HISTORY — PX: BIOPSY: SHX5522

## 2019-12-18 HISTORY — PX: COLONOSCOPY WITH PROPOFOL: SHX5780

## 2019-12-18 LAB — GLUCOSE, CAPILLARY
Glucose-Capillary: 454 mg/dL — ABNORMAL HIGH (ref 70–99)
Glucose-Capillary: 405 mg/dL — ABNORMAL HIGH (ref 70–99)

## 2019-12-18 LAB — SARS CORONAVIRUS 2 (TAT 6-24 HRS): SARS Coronavirus 2: NEGATIVE

## 2019-12-18 SURGERY — COLONOSCOPY WITH PROPOFOL
Anesthesia: Monitor Anesthesia Care

## 2019-12-18 SURGERY — ESOPHAGOGASTRODUODENOSCOPY (EGD) WITH PROPOFOL
Anesthesia: Monitor Anesthesia Care

## 2019-12-18 MED ORDER — PROPOFOL 10 MG/ML IV BOLUS
INTRAVENOUS | Status: DC | PRN
  Administered 2019-12-18: 150 ug/kg/min via INTRAVENOUS

## 2019-12-18 MED ORDER — NADOLOL 20 MG PO TABS
20.0000 mg | ORAL_TABLET | Freq: Every day | ORAL | 11 refills | Status: DC
Start: 2019-12-18 — End: 2019-12-19

## 2019-12-18 MED ORDER — SODIUM CHLORIDE 0.9 % IV SOLN: INTRAVENOUS | Status: DC

## 2019-12-18 MED ORDER — PROPOFOL 500 MG/50ML IV EMUL
INTRAVENOUS | Status: AC
  Filled 2019-12-18: qty 50

## 2019-12-18 MED ORDER — GLYCOPYRROLATE 0.2 MG/ML IJ SOLN
INTRAMUSCULAR | Status: DC | PRN
  Administered 2019-12-18: .1 mg via INTRAVENOUS

## 2019-12-18 MED ORDER — INSULIN ASPART 100 UNIT/ML ~~LOC~~ SOLN
SUBCUTANEOUS | Status: AC
  Filled 2019-12-18: qty 1

## 2019-12-18 MED ORDER — INSULIN ASPART 100 UNIT/ML ~~LOC~~ SOLN
30.0000 [IU] | Freq: Once | SUBCUTANEOUS | Status: AC
  Administered 2019-12-18: 30 [IU] via SUBCUTANEOUS

## 2019-12-18 MED ORDER — SPIRONOLACTONE 100 MG PO TABS
100.0000 mg | ORAL_TABLET | Freq: Every day | ORAL | 11 refills | Status: DC
Start: 2019-12-18 — End: 2020-02-04

## 2019-12-18 MED ORDER — FUROSEMIDE 40 MG PO TABS
40.0000 mg | ORAL_TABLET | Freq: Every day | ORAL | 11 refills | Status: DC
Start: 2019-12-18 — End: 2020-04-06

## 2019-12-18 MED ORDER — PHENYLEPHRINE HCL-NACL 10-0.9 MG/250ML-% IV SOLN
INTRAVENOUS | Status: DC | PRN
  Administered 2019-12-18: 35 ug/min via INTRAVENOUS

## 2019-12-18 MED ORDER — LIDOCAINE HCL (CARDIAC) PF 100 MG/5ML IV SOSY
PREFILLED_SYRINGE | INTRAVENOUS | Status: DC | PRN
  Administered 2019-12-18: 100 mg via INTRAVENOUS

## 2019-12-18 MED ORDER — LACTATED RINGERS IV SOLN: INTRAVENOUS | Status: DC

## 2019-12-18 SURGICAL SUPPLY — 24 items

## 2019-12-18 NOTE — Interval H&P Note (Signed)
History and Physical Interval Note:  12/18/2019 10:14 AM  Spencer Fletcher  has presented today for surgery, with the diagnosis of Cirrhosis with portal hypertension, esophageal varicies, abdominal pain.  The various methods of treatment have been discussed with the patient and family. After consideration of risks, benefits and other options for treatment, the patient has consented to  Procedure(s): COLONOSCOPY WITH PROPOFOL (N/A) ESOPHAGOGASTRODUODENOSCOPY (EGD) WITH PROPOFOL (N/A) as a surgical intervention.  The patient's history has been reviewed, patient examined, no change in status, stable for surgery.  I have reviewed the patient's chart and labs.  Questions were answered to the patient's satisfaction.     Rachael Fee

## 2019-12-18 NOTE — Anesthesia Preprocedure Evaluation (Signed)
Anesthesia Evaluation  Patient identified by MRN, date of birth, ID band Patient awake    Reviewed: Allergy & Precautions, NPO status , Patient's Chart, lab work & pertinent test results  Airway Mallampati: II  TM Distance: >3 FB Neck ROM: Full    Dental no notable dental hx. (+) Dental Advisory Given, Poor Dentition   Pulmonary sleep apnea , former smoker,  Intubated for 30d surrounding his cardiac arrest in Jan 2021, subsequently trached and sent to rehab. Trach removed, no breathing issues since then.    Pulmonary exam normal breath sounds clear to auscultation       Cardiovascular Exercise Tolerance: Poor Normal cardiovascular exam Rhythm:Regular Rate:Normal  09/04/2019 with a complaint of shortness of breath and chills, he had a cardiac arrest in the emergency room and then achieved spontaneous circulation within less than a minute.  He was intubated and transferred to the ICU.  They did a CT scan of the chest abdomen pelvis which showed cirrhosis with portal hypertension and moderate ascites.  He was started on broad-spectrum antibiotics for possible aspiration pneumonia and paracentesis was done twice.  He had another cardiac arrest during his stay in the ICU.  They were able to wean him from the vent and they transferred him to [facility] on 09/17/2019.  He underwent PEG and trach placement.  He was then decannulated on 10/17/2019 and was on 2 L of oxygen.  He did have blood cultures x2 that grew out Paryimonas micra and he was placed on cefazolin until 10/10/2019.  Is recommended he have a repeat CT scan of the abdomen pelvis with contrast following discharge to look for possible malignancy due to his organism being associated with GI malignancy.  Was also recommended he have a repeat paracentesis while he was at [place].  He did have hepatic encephalopathy during his stay and he was on lactulose.   Neuro/Psych negative neurological  ROS  negative psych ROS   GI/Hepatic GERD  Medicated and Controlled,(+) Cirrhosis   Esophageal Varices    , Portal hypertension, abdominal pain, esophageal varices   Endo/Other  diabetes, Poorly Controlled, Type 2, Insulin DependentHas been poorly controlled since getting his COVID vaccination (>300). Took 1/2 his normal dose of long acting insulin last night and nothing this AM. [id number]  Renal/GU negative Renal ROS  negative genitourinary   Musculoskeletal negative musculoskeletal ROS (+)   Abdominal Normal abdominal exam  (+)   Peds negative pediatric ROS (+)  Hematology negative hematology ROS (+)   Anesthesia Other Findings   Reproductive/Obstetrics negative OB ROS                             Anesthesia Physical Anesthesia Plan  ASA: IV  Anesthesia Plan: MAC   Post-op Pain Management:    Induction:   PONV Risk Score and Plan: 2 and Propofol infusion and TIVA  Airway Management Planned: Natural Airway and Simple Face Mask  Additional Equipment: None  Intra-op Plan:   Post-operative Plan:   Informed Consent: I have reviewed the patients History and Physical, chart, labs and discussed the procedure including the risks, benefits and alternatives for the proposed anesthesia with the patient or authorized representative who has indicated his/her understanding and acceptance.       Plan Discussed with: CRNA  Anesthesia Plan Comments: (30 units novolog given pre-procedure for FS 454 in preop)        Anesthesia Quick Evaluation

## 2019-12-18 NOTE — Discharge Instructions (Signed)
YOU HAD AN ENDOSCOPIC PROCEDURE TODAY: Refer to the procedure report and other information in the discharge instructions given to you for any specific questions about what was found during the examination. If this information does not answer your questions, please call [place] office at [phone] to clarify.  ° °YOU SHOULD EXPECT: Some feelings of bloating in the abdomen. Passage of more gas than usual. Walking can help get rid of the air that was put into your GI tract during the procedure and reduce the bloating. If you had a lower endoscopy (such as a colonoscopy or flexible sigmoidoscopy) you may notice spotting of blood in your stool or on the toilet paper. Some abdominal soreness may be present for a day or two, also. ° °DIET: Your first meal following the procedure should be a light meal and then it is ok to progress to your normal diet. A half-sandwich or bowl of soup is an example of a good first meal. Heavy or fried foods are harder to digest and may make you feel nauseous or bloated. Drink plenty of fluids but you should avoid alcoholic beverages for 24 hours. If you had a esophageal dilation, please see attached instructions for diet.   ° °ACTIVITY: Your care partner should take you home directly after the procedure. You should plan to take it easy, moving slowly for the rest of the day. You can resume normal activity the day after the procedure however YOU SHOULD NOT DRIVE, use power tools, machinery or perform tasks that involve climbing or major physical exertion for 24 hours (because of the sedation medicines used during the test).  ° °SYMPTOMS TO REPORT IMMEDIATELY: °A gastroenterologist can be reached at any hour. Please call [phone]  for any of the following symptoms:  °Following lower endoscopy (colonoscopy, flexible sigmoidoscopy) °Excessive amounts of blood in the stool  °Significant tenderness, worsening of abdominal pains  °Swelling of the abdomen that is new, acute  °Fever of 100° or  higher  °Following upper endoscopy (EGD, EUS, ERCP, esophageal dilation) °Vomiting of blood or coffee ground material  °New, significant abdominal pain  °New, significant chest pain or pain under the shoulder blades  °Painful or persistently difficult swallowing  °New shortness of breath  °Black, tarry-looking or red, bloody stools ° °FOLLOW UP:  °If any biopsies were taken you will be contacted by phone or by letter within the next 1-3 weeks. Call [phone]  if you have not heard about the biopsies in 3 weeks.  °Please also call with any specific questions about appointments or follow up tests. ° °

## 2019-12-18 NOTE — Anesthesia Postprocedure Evaluation (Signed)
Anesthesia Post Note  Patient: Spencer Fletcher  Procedure(s) Performed: COLONOSCOPY WITH PROPOFOL (N/A ) ESOPHAGOGASTRODUODENOSCOPY (EGD) WITH PROPOFOL (N/A ) BIOPSY     Patient location during evaluation: PACU Anesthesia Type: MAC Level of consciousness: awake and alert Pain management: pain level controlled Vital Signs Assessment: post-procedure vital signs reviewed and stable Respiratory status: spontaneous breathing, nonlabored ventilation and respiratory function stable Cardiovascular status: blood pressure returned to baseline and stable Postop Assessment: no apparent nausea or vomiting Anesthetic complications: no    Last Vitals:  Vitals:   12/18/19 1225 12/18/19 1230  BP: 112/74 110/78  Pulse: 82 81  Resp: 12 12  Temp:    SpO2: 94% 93%    Last Pain:  Vitals:   12/18/19 1230  TempSrc:   PainSc: 0-No pain                 Pervis Hocking

## 2019-12-18 NOTE — Op Note (Signed)
[facility] Patient Name: Spencer Fletcher Procedure Date: 12/18/2019 MRN: [id number] Attending MD: Milus Banister , MD Date of Birth: [birth year] CSN: [id number] Age: 59 Admit Type: Outpatient Procedure:                Colonoscopy Indications:              Screening for colorectal malignant neoplasm, known                            etoh cirrhosis. Providers:                Milus Banister, MD, Theodora Blow, Technician,                            Josie Dixon, RN Referring MD:              Medicines:                Monitored Anesthesia Care Complications:            No immediate complications. Estimated blood loss:                            None. Estimated Blood Loss:     Estimated blood loss: none. Procedure:                Pre-Anesthesia Assessment:                           - Prior to the procedure, a History and Physical                            was performed, and patient medications and                            allergies were reviewed. The patient's tolerance of                            previous anesthesia was also reviewed. The risks                            and benefits of the procedure and the sedation                            options and risks were discussed with the patient.                            All questions were answered, and informed consent                            was obtained. Prior Anticoagulants: The patient has                            taken no previous anticoagulant or antiplatelet                            agents. ASA Grade Assessment: III - A  patient with                            severe systemic disease. After reviewing the risks                            and benefits, the patient was deemed in                            satisfactory condition to undergo the procedure.                           After obtaining informed consent, the colonoscope                            was passed under direct vision. Throughout the                           procedure, the patient's blood pressure, pulse, and                            oxygen saturations were monitored continuously. The                            CF-HQ190L ([id number]) Olympus colonoscope was                            introduced through the anus and advanced to the the                            cecum, identified by appendiceal orifice and                            ileocecal valve. The colonoscopy was performed                            without difficulty. The patient tolerated the                            procedure well. The quality of the bowel                            preparation was excellent. Scope In: 11:21:58 AM Scope Out: 11:35:25 AM Scope Withdrawal Time: 0 hours 5 minutes 37 seconds  Total Procedure Duration: 0 hours 13 minutes 27 seconds  Findings:      A few small-mouthed diverticula were found in the entire colon.      Prominant rectal vessels.      The exam was otherwise without abnormality on direct and retroflexion       views. Impression:               - Diverticulosis in the entire examined colon.                           - Prominant rectal vessels, perhaps due to portal  hypertension                           - The examination was otherwise normal on direct                            and retroflexion views.                           - No polyps or cancers. Moderate Sedation:      Not Applicable - Patient had care per Anesthesia. Recommendation:           - EGd now.                           - Repeat colonoscopy in 10 years for screening                            purposes. Procedure Code(s):        --- Professional ---                           [phone], Colonoscopy, flexible; diagnostic, including                            collection of specimen(s) by brushing or washing,                            when performed (separate procedure) Diagnosis Code(s):        --- Professional ---                            Z12.11, Encounter for screening for malignant                            neoplasm of colon                           K57.30, Diverticulosis of large intestine without                            perforation or abscess without bleeding CPT copyright 2019 American Medical Association. All rights reserved. The codes documented in this report are preliminary and upon coder review may  be revised to meet current compliance requirements. Rachael Fee, MD 12/18/2019 11:39:26 AM This report has been signed electronically. Number of Addenda: 0

## 2019-12-18 NOTE — Op Note (Signed)
[facility] Patient Name: Spencer Fletcher Procedure Date: 12/18/2019 MRN: [id number] Attending MD: Rachael Fee , MD Date of Birth: 1960/11/24 CSN: [id number] Age: 59 Admit Type: Outpatient Procedure:                Upper GI endoscopy Indications:              cirrhosis, screening for varices Providers:                Rachael Fee, MD, Dayton Bailiff, RN, Wanita Chamberlain, Technician Referring MD:              Medicines:                Monitored Anesthesia Care Complications:            No immediate complications. Estimated blood loss:                            None. Estimated Blood Loss:     Estimated blood loss: none. Procedure:                Pre-Anesthesia Assessment:                           - Prior to the procedure, a History and Physical                            was performed, and patient medications and                            allergies were reviewed. The patient's tolerance of                            previous anesthesia was also reviewed. The risks                            and benefits of the procedure and the sedation                            options and risks were discussed with the patient.                            All questions were answered, and informed consent                            was obtained. Prior Anticoagulants: The patient has                            taken no previous anticoagulant or antiplatelet                            agents. ASA Grade Assessment: III - A patient with  severe systemic disease. After reviewing the risks                            and benefits, the patient was deemed in                            satisfactory condition to undergo the procedure.                           After obtaining informed consent, the endoscope was                            passed under direct vision. Throughout the                            procedure, the patient's blood  pressure, pulse, and                            oxygen saturations were monitored continuously. The                            [id number]-[id number] ([id number]) Olympus gastroscope was                            introduced through the mouth, and advanced to the                            second part of duodenum. The upper GI endoscopy was                            accomplished without difficulty. The patient                            tolerated the procedure well. Scope In: Scope Out: Findings:      - Mild, non-specific distal gastritis. Biopsied to check for H. pylori.      - Large distal esophagus varices (two trunks) as well as possible       proximal gastric varices. No recent or old blood in the stomach.      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 Impression:               - Mild, non-specific distal gastritis. Biopsied to                            check for H. pylori.                           - Large distal esophagus varices (two trunks) as                            well as possible proximal gastric varices.                           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                           - The examination was otherwise normal. Moderate Sedation:      Not Applicable - Patient had care per Anesthesia. Recommendation:           - Patient has a contact number available for                            emergencies. The signs and symptoms of potential                            delayed complications were discussed with the                            patient. Return to normal activities tomorrow.                            Written discharge instructions were provided to the                            patient.                           - Resume previous diet.                           - Continue present medications. Change lasix dosing                            to 40mg  once daily. Change aldactone dosing to                             100mg  once daily. Start nadolol 20mg  pills, one                            pill once daily (new scripts written for this).                            bmet in 2 weeks.                           - Await pathology results.                           - OV with Dr. ' RN for BP/HR check in 2 weeks                           - OV with Dr. in 6-7 weeks. Procedure Code(s):        --- Professional ---                           [phone], Esophagogastroduodenoscopy, flexible,  transoral; with biopsy, single or multiple Diagnosis Code(s):        --- Professional ---                           K29.70, Gastritis, unspecified, without bleeding                           R12, Heartburn CPT copyright 2019 American Medical Association. All rights reserved. The codes documented in this report are preliminary and upon coder review may  be revised to meet current compliance requirements. Milus Banister, MD 12/18/2019 11:58:17 AM This report has been signed electronically. Number of Addenda: 0

## 2019-12-18 NOTE — Transfer of Care (Signed)
Immediate Anesthesia Transfer of Care Note  Patient: MARCIN HOLTE  Procedure(s) Performed: COLONOSCOPY WITH PROPOFOL (N/A ) ESOPHAGOGASTRODUODENOSCOPY (EGD) WITH PROPOFOL (N/A ) BIOPSY  Patient Location: PACU  Anesthesia Type:MAC  Level of Consciousness: sedated and patient cooperative  Airway & Oxygen Therapy: Patient Spontanous Breathing and Patient connected to face mask oxygen  Post-op Assessment: Report given to RN and Post -op Vital signs reviewed and stable  Post vital signs: stable  Last Vitals:  Vitals Value Taken Time  BP 124/48 12/18/19 1155  Temp    Pulse 78 12/18/19 1156  Resp 16 12/18/19 1156  SpO2 100 % 12/18/19 1156  Vitals shown include unvalidated device data.  Last Pain:  Vitals:   12/18/19 1033  TempSrc: Oral  PainSc: 3          Complications: No apparent anesthesia complications

## 2019-12-18 NOTE — Telephone Encounter (Signed)
-----   Message from Rachael Fee, MD sent at 12/18/2019 12:01 PM EDT ----- Elvina Sidle,  Can you call him tomorrow, just had EGD and colonoscoyp today at [facility].    Make sure he is taking lasix (40mg  pill, once daily) and aldactone (100mg  pill, once daily) correctly, I changed him to those new doses yesterday.  Also make sure he started nadolol 20mg  once daily (new script).  He needs BMET and RN visit in 2 weeks (BP/HR check). He needs OV with me in 6 weeks.     Thanks

## 2019-12-18 NOTE — Progress Notes (Signed)
Pt CBG post procedure 405.  MD Fin[place]ane notified.  No new orders.  Will continue to monitor pt.

## 2019-12-18 NOTE — Anesthesia Procedure Notes (Signed)
Procedure Name: MAC Date/Time: 12/18/2019 11:12 AM Performed by: Lissa Morales, CRNA Pre-anesthesia Checklist: Patient identified, Emergency Drugs available, Suction available, Patient being monitored and Timeout performed Patient Re-evaluated:Patient Re-evaluated prior to induction Oxygen Delivery Method: Simple face mask Placement Confirmation: positive ETCO2

## 2019-12-19 ENCOUNTER — Telehealth: Payer: Self-pay

## 2019-12-19 ENCOUNTER — Other Ambulatory Visit: Payer: Self-pay

## 2019-12-19 ENCOUNTER — Encounter: Payer: Self-pay | Admitting: *Deleted

## 2019-12-19 DIAGNOSIS — K7031 Alcoholic cirrhosis of liver with ascites: Secondary | ICD-10-CM

## 2019-12-19 LAB — SURGICAL PATHOLOGY

## 2019-12-19 MED ORDER — PROPRANOLOL HCL 10 MG PO TABS: 10.0000 mg | ORAL_TABLET | Freq: Three times a day (TID) | ORAL | 11 refills | Status: DC

## 2019-12-19 NOTE — Telephone Encounter (Signed)
The pt has been advised of the appt for labs and states his wife is a Engineer, civil (consulting) and will check his BP/HR and call with those numbers.  Appt made for follow up in June with Dr Christella Hartigan   The pt verbalized understanding of the medication orders. He did advise that he could not afford nadolol and Dr Christella Hartigan sent in an alternate medication.

## 2019-12-19 NOTE — Telephone Encounter (Signed)
-----   Message from Rachael Fee, MD sent at 12/19/2019  7:12 AM EDT ----- Regarding: RE: Nadolol Ok, let's try propranolol 10 mg pills 1 pill 3 times daily.  Dispense 90 pills with 11 refills.  Thank you  ----- Message ----- From: Lamona Curl, CMA Sent: 12/18/2019   4:43 PM EDT To: Rachael Fee, MD Subject: Nadolol                                        Received fax from pharmacy that Nadolol is not covered by insurance.  They are requesting another medication.  Please advise.  Thank you

## 2019-12-19 NOTE — Telephone Encounter (Signed)
Patient notified that Nadolol is not covered by insurance and we will call in propranolol 10mg  take 1 tablet three times daily. Rx sent to pharmacy. Patient agreed to plan and verbalized understanding.  No further questions.

## 2019-12-19 NOTE — Telephone Encounter (Signed)
Left message for patient to return call to discuss medication changes per Dr Christella Hartigan.  Will continue efforts.

## 2019-12-25 ENCOUNTER — Other Ambulatory Visit (INDEPENDENT_AMBULATORY_CARE_PROVIDER_SITE_OTHER): Payer: Medicare Other

## 2019-12-25 ENCOUNTER — Ambulatory Visit (INDEPENDENT_AMBULATORY_CARE_PROVIDER_SITE_OTHER): Payer: Self-pay | Admitting: Gastroenterology

## 2019-12-25 VITALS — BP 98/64 | HR 64 | Temp 96.6°F

## 2019-12-25 DIAGNOSIS — K7031 Alcoholic cirrhosis of liver with ascites: Secondary | ICD-10-CM

## 2019-12-25 LAB — BASIC METABOLIC PANEL
BUN: 19 mg/dL (ref 6–23)
CO2: 30 mEq/L (ref 19–32)
Calcium: 10.3 mg/dL (ref 8.4–10.5)
Chloride: 93 mEq/L — ABNORMAL LOW (ref 96–112)
Creatinine, Ser: 1.52 mg/dL — ABNORMAL HIGH (ref 0.40–1.50)
GFR: 47.15 mL/min — ABNORMAL LOW (ref >60.00)
Glucose, Bld: 343 mg/dL — ABNORMAL HIGH (ref 70–99)
Potassium: 4.7 mEq/L (ref 3.5–5.1)
Sodium: 130 mEq/L — ABNORMAL LOW (ref 135–145)

## 2019-12-25 NOTE — Progress Notes (Signed)
Patient comes to the office today for blood pressure check after recently starting propranolol 10mg  three times daily.  Patient states that he has been "feeling foggy" since starting the medication.  He states that "he doesn't feel like he can think clearly."  Patient continues to monitor blood pressure and heart rate from home.  Patient advised blood pressure readings will be sent to Dr for his review.  Patient agreed to plan and verbalized understanding.  No further questions.

## 2019-12-26 ENCOUNTER — Other Ambulatory Visit: Payer: Self-pay

## 2019-12-26 DIAGNOSIS — K7031 Alcoholic cirrhosis of liver with ascites: Secondary | ICD-10-CM

## 2019-12-27 ENCOUNTER — Telehealth: Payer: Self-pay | Admitting: Nurse Practitioner

## 2019-12-27 NOTE — Telephone Encounter (Signed)
Patient's spouse called concerned that her husband is confused having mild hallucinations he complains of chest heaviness which is new since his prior telephone call to Dr. Christella Hartigan earlier this week.  His propanolol dose was decreased to bid  and spironolactone was discontinued on 5/7 due to sodium level 130 and increasing creatinine 1.52.  In the setting of cirrhosis at risk for hepatic encephalopathy further renal compromise I advised the patient to go to the emergency room for further evaluation and laboratory studies.  The wife agreed to take him to [facility] emergency room at this time.

## 2019-12-29 ENCOUNTER — Ambulatory Visit (INDEPENDENT_AMBULATORY_CARE_PROVIDER_SITE_OTHER): Payer: Medicare Other | Admitting: "Endocrinology

## 2019-12-29 ENCOUNTER — Encounter: Payer: Self-pay | Admitting: "Endocrinology

## 2019-12-29 ENCOUNTER — Telehealth: Payer: Self-pay | Admitting: Gastroenterology

## 2019-12-29 ENCOUNTER — Other Ambulatory Visit: Payer: Self-pay

## 2019-12-29 VITALS — BP 98/68 | HR 56 | Ht 69.0 in | Wt 201.0 lb

## 2019-12-29 DIAGNOSIS — E1159 Type 2 diabetes mellitus with other circulatory complications: Secondary | ICD-10-CM

## 2019-12-29 MED ORDER — TOUJEO MAX SOLOSTAR 300 UNIT/ML ~~LOC~~ SOPN: 50.0000 [IU] | PEN_INJECTOR | Freq: Every day | SUBCUTANEOUS | 2 refills | Status: DC

## 2019-12-29 NOTE — Patient Instructions (Signed)

## 2019-12-29 NOTE — Telephone Encounter (Signed)
Okay, thanks.  No other changes except what you discussed with him last week, see his bmet results note.  Thanks

## 2019-12-29 NOTE — Progress Notes (Signed)
Endocrinology Consult Note       12/29/2019, 5:01 PM   Subjective:    Patient ID: Spencer Fletcher, male    DOB: 07/03/1961.  Spencer Fletcher is being seen in consultation for management of currently uncontrolled symptomatic diabetes requested by  Sherrilee Gilles, DO.   Past Medical History:  Diagnosis Date  . Diabetes mellitus without complication ([place])   . Sleep apnea     Past Surgical History:  Procedure Laterality Date  . BIOPSY  12/18/2019   Procedure: BIOPSY;  Surgeon: Milus Banister, MD;  Location: [facility];  Service: Endoscopy;;  . COLONOSCOPY WITH PROPOFOL N/A 12/18/2019   Procedure: COLONOSCOPY WITH PROPOFOL;  Surgeon: Milus Banister, MD;  Location: [facility];  Service: Endoscopy;  Laterality: N/A;  . ESOPHAGOGASTRODUODENOSCOPY (EGD) WITH PROPOFOL N/A 12/18/2019   Procedure: ESOPHAGOGASTRODUODENOSCOPY (EGD) WITH PROPOFOL;  Surgeon: Milus Banister, MD;  Location: [facility];  Service: Endoscopy;  Laterality: N/A;    Social History   Socioeconomic History  . Marital status: Married    Spouse name: Not on file  . Number of children: Not on file  . Years of education: Not on file  . Highest education level: Not on file  Occupational History  . Not on file  Tobacco Use  . Smoking status: Former Smoker    Types: Cigarettes    Quit date: 08/22/2019    Years since quitting: 0.3  . Smokeless tobacco: Never Used  Substance and Sexual Activity  . Alcohol use: Not Currently  . Drug use: Not Currently  . Sexual activity: Not on file  Other Topics Concern  . Not on file  Social History Narrative  . Not on file   Social Determinants of Health   Financial Resource Strain:   . Difficulty of Paying Living Expenses:   Food Insecurity:   . Worried About Charity fundraiser in the Last Year:   . Arboriculturist in the Last Year:   Transportation Needs:   . Film/video editor  (Medical):   Marland Kitchen Lack of Transportation (Non-Medical):   Physical Activity:   . Days of Exercise per Week:   . Minutes of Exercise per Session:   Stress:   . Feeling of Stress :   Social Connections:   . Frequency of Communication with Friends and Family:   . Frequency of Social Gatherings with Friends and Family:   . Attends Religious Services:   . Active Member of Clubs or Organizations:   . Attends Archivist Meetings:   Marland Kitchen Marital Status:     Family History  Problem Relation Age of Onset  . Breast cancer Mother   . Diabetes Father   . Heart disease Father     Outpatient Encounter Medications as of 12/29/2019  Medication Sig  . insulin aspart (NOVOLOG) 100 UNIT/ML FlexPen Inject 10-16 Units into the skin 3 (three) times daily before meals.  Marland Kitchen buPROPion (WELLBUTRIN XL) 150 MG 24 hr tablet Take 150 mg by mouth daily.  . citalopram (CELEXA) 10 MG tablet Take 10 mg by mouth at bedtime.   . clonazePAM (KLONOPIN)  0.5 MG tablet Take 0.5 mg by mouth at bedtime.   . docusate sodium (COLACE) 100 MG capsule Take 100 mg by mouth 2 (two) times daily.  . fentaNYL (DURAGESIC) 12 MCG/HR Place 1 patch onto the skin every 3 (three) days.   . folic acid (FOLVITE) 1 MG tablet Take 1 mg by mouth daily.  . furosemide (LASIX) 40 MG tablet Take 1 tablet (40 mg total) by mouth daily.  . Glucagon, rDNA, (GLUCAGON EMERGENCY) 1 MG KIT Inject 1 mg into the muscle as needed for low blood sugar.  . insulin glargine, 2 Unit Dial, (TOUJEO MAX SOLOSTAR) 300 UNIT/ML Solostar Pen Inject 50 Units into the skin at bedtime.  Marland Kitchen lactulose (CHRONULAC) 10 GM/15ML solution Take 40 g by mouth in the morning and at bedtime.   Marland Kitchen loratadine (CLARITIN) 10 MG tablet Take 10 mg by mouth daily as needed for allergies.   Marland Kitchen metoCLOPramide (REGLAN) 10 MG tablet Take 10 mg by mouth in the morning, at noon, and at bedtime.  . naloxone (NARCAN) 4 MG/0.1ML LIQD nasal spray kit Place 1 spray into the nose as needed (accidental  overdose.).   Marland Kitchen nitroGLYCERIN (NITROSTAT) 0.3 MG SL tablet Place 0.3 mg under the tongue every 5 (five) minutes x 3 doses as needed for chest pain.  . pantoprazole (PROTONIX) 40 MG tablet Take 40 mg by mouth daily.  . polyethylene glycol (MIRALAX / GLYCOLAX) 17 g packet Take 17 g by mouth daily.  . pregabalin (LYRICA) 200 MG capsule Take 200 mg by mouth in the morning, at noon, and at bedtime.   . propranolol (INDERAL) 10 MG tablet Take 1 tablet (10 mg total) by mouth 3 (three) times daily.  . rifaximin (XIFAXAN) 550 MG TABS tablet Take 550 mg by mouth 2 (two) times daily.  Marland Kitchen spironolactone (ALDACTONE) 100 MG tablet Take 1 tablet (100 mg total) by mouth daily.  Marland Kitchen thiamine (VITAMIN B-1) 100 MG tablet Take 100 mg by mouth daily.  Marland Kitchen UREA 20 INTENSIVE HYDRATING 20 % cream Apply 1 application topically daily as needed (irritated areas of feet).   . [DISCONTINUED] oxyCODONE (OXY IR/ROXICODONE) 5 MG immediate release tablet Take 5 mg by mouth 2 (two) times daily as needed for pain.  . [DISCONTINUED] potassium chloride SA (KLOR-CON M20) 20 MEQ tablet Take 20 mEq by mouth daily.    No facility-administered encounter medications on file as of 12/29/2019.    ALLERGIES: Allergies  Allergen Reactions  . Ambien [Zolpidem] Other (See Comments)  . Carafate [Sucralfate] Hives and Itching    VACCINATION STATUS:  There is no immunization history on file for this patient.  Diabetes He presents for his initial diabetic visit. He has type 2 diabetes mellitus. Onset time: He was diagnosed at proximate age of 30 years. His disease course has been worsening. There are no hypoglycemic associated symptoms. Pertinent negatives for hypoglycemia include no confusion, headaches, pallor or seizures. Associated symptoms include blurred vision, polydipsia and polyuria. Pertinent negatives for diabetes include no chest pain, no fatigue, no polyphagia and no weakness. There are no hypoglycemic complications. Symptoms are  worsening. Diabetic complications include heart disease. (Patient has multiple complications including cardiac arrest x2, peripheral neuropathy, polypharmacy, recurrent pneumonia.  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 ) Risk factors for coronary artery disease include diabetes mellitus, male sex, obesity, sedentary lifestyle and tobacco exposure. When asked about current treatments, none (This patient took a large dose of insulin in the past, however he has not taken  any medications for the last 4 weeks after he was discharged from hospital where he was admitted for cardiac arrest, which required admission in rehab at [facility].) were reported. His weight is fluctuating dramatically (Patient has history of obesity is body weight up to 290 pounds.  Currently weighs 201 pounds.). He is following a generally unhealthy diet. When asked about meal planning, he reported none. He has not had a previous visit with a dietitian. He never participates in exercise. His home blood glucose trend is increasing steadily. His breakfast blood glucose range is generally >200 mg/dl. His lunch blood glucose range is generally >200 mg/dl. His dinner blood glucose range is generally >200 mg/dl. His bedtime blood glucose range is generally >200 mg/dl. His overall blood glucose range is >200 mg/dl. (He uses her Dexcom CGM device which showed average blood glucose of 300 for the last 90 days.  His most recent labs are not available to review, patient reports that he might have A1c done during his stay in [facility].) An ACE inhibitor/angiotensin II receptor blocker is not being taken. Eye exam is current.     Review of Systems  Constitutional: Negative for chills, fatigue, fever and unexpected weight change.  HENT: Negative for dental problem, mouth sores and trouble swallowing.   Eyes: Positive for blurred vision. Negative for visual disturbance.  Respiratory: Negative for cough, choking, chest  tightness, shortness of breath and wheezing.   Cardiovascular: Negative for chest pain, palpitations and leg swelling.  Gastrointestinal: Negative for abdominal distention, abdominal pain, constipation, diarrhea, nausea and vomiting.  Endocrine: Positive for polydipsia and polyuria. Negative for polyphagia.  Genitourinary: Negative for dysuria, flank pain, hematuria and urgency.  Musculoskeletal: Positive for arthralgias, back pain, gait problem and myalgias. Negative for neck pain.  Skin: Negative for pallor, rash and wound.  Neurological: Negative for seizures, syncope, weakness, numbness and headaches.  Psychiatric/Behavioral: Negative for confusion and dysphoric mood.    Objective:    Vitals with BMI 12/29/2019 12/25/2019 12/18/2019  Height 5' 9"  - -  Weight 201 lbs - -  BMI 82.42 - -  Systolic 98 98 353  Diastolic 68 64 78  Pulse 56 64 81    BP 98/68   Pulse (!) 56   Ht 5' 9"  (1.753 m)   Wt 201 lb (91.2 kg)   BMI 29.68 kg/m   Wt Readings from Last 3 Encounters:  12/29/19 201 lb (91.2 kg)  12/18/19 200 lb (90.7 kg)  12/03/19 200 lb (90.7 kg)     Physical Exam Constitutional:      General: He is not in acute distress.    Appearance: He is well-developed.  HENT:     Head: Normocephalic and atraumatic.  Neck:     Thyroid: No thyromegaly.     Trachea: No tracheal deviation.  Cardiovascular:     Rate and Rhythm: Normal rate.     Pulses:          Dorsalis pedis pulses are 1+ on the right side and 1+ on the left side.       Posterior tibial pulses are 1+ on the right side and 1+ on the left side.     Heart sounds: S1 normal and S2 normal. No murmur. No gallop.   Pulmonary:     Effort: Pulmonary effort is normal. No respiratory distress.     Breath sounds: No wheezing.  Abdominal:     General: There is no distension.     Tenderness: There is no  abdominal tenderness. There is no guarding.  Musculoskeletal:     Right shoulder: No swelling or deformity.     Cervical  back: Normal range of motion and neck supple.     Comments: global loss of skeletal muscle mass.  Skin:    General: Skin is warm and dry.     Findings: No rash.     Nails: There is no clubbing.  Neurological:     Mental Status: He is alert and oriented to person, place, and time.     Cranial Nerves: No cranial nerve deficit.     Sensory: No sensory deficit.     Gait: Gait normal.     Deep Tendon Reflexes: Reflexes are normal and symmetric.  Psychiatric:        Speech: Speech normal.        Behavior: Behavior normal. Behavior is cooperative.        Thought Content: Thought content normal.        Judgment: Judgment normal.     CMP ( most recent) CMP     Component Value Date/Time   NA 130 (L) 12/25/2019 0804   K 4.7 12/25/2019 0804   CL 93 (L) 12/25/2019 0804   CO2 30 12/25/2019 0804   GLUCOSE 343 (H) 12/25/2019 0804   BUN 19 12/25/2019 0804   CREATININE 1.52 (H) 12/25/2019 0804   CALCIUM 10.3 12/25/2019 0804   PROT 8.5 (H) 12/03/2019 0945   ALBUMIN 4.7 12/03/2019 0945   AST 29 12/03/2019 0945   ALT 28 12/03/2019 0945   ALKPHOS 83 12/03/2019 0945   BILITOT 1.2 12/03/2019 0945     Diabetic Labs (most recent): Lab Results  Component Value Date   HGBA1C 8.8 02/19/2019     Assessment & Plan:   1. Type 2 diabetes mellitus with other circulatory complication, without long-term current use of insulin ([place])   - Spencer Fletcher has currently uncontrolled symptomatic type 2 DM since  59 years of age,  with most recent A1c of 8.8 %. Recent labs reviewed. - I had a long discussion with him about the progressive nature of diabetes and the pathology behind its complications. -his diabetes is complicated by coronary artery disease, peripheral arterial disease, peripheral neuropathy, obesity, hepatic cirrhosis and he remains at a high risk for more acute and chronic complications which include CAD, CVA, CKD, retinopathy, and neuropathy. These are all discussed in detail with  him.  - I have counseled him on diet  and weight management  by adopting a carbohydrate restricted/protein rich diet. Patient is encouraged to switch to  unprocessed or minimally processed     complex starch and increased protein intake (animal or plant source), fruits, and vegetables. -  he is advised to stick to a routine mealtimes to eat 3 meals  a day and avoid unnecessary snacks ( to snack only to correct hypoglycemia).   - he admits that there is a room for improvement in his food and drink choices. - Suggestion is made for him to avoid simple carbohydrates  from his diet including Cakes, Sweet Desserts, Ice Cream, Soda (diet and regular), Sweet Tea, Candies, Chips, Cookies, Store Bought Juices, Alcohol in Excess of  1-2 drinks a day, Artificial Sweeteners,  Coffee Creamer, and "Sugar-free" Products. This will help patient to have more stable blood glucose profile and potentially avoid unintended weight gain.  - he will be scheduled with Jearld Fenton, RDN, CDE for diabetes education.  - I have approached him with the  following individualized plan to manage  his diabetes and patient agrees:   -Based on his current and prevailing glycemic burden, this patient will need treatment with basal/bolus insulin.  He has history of using Toujeo 112 units daily as well as NovoLog up to 65 units  3 times a day.  However he has not taken any treatment for the last 3 to 4 weeks. -He will be admitted for next 2 weeks regimen slowly.  I discussed and initiated Toujeo 50 units nightly, discussed initiating NovoLog 10-16 units 3 times daily AC for Premeal blood glucose readings greater than 90 mg per DL, associated with consistent use of his CGM device-scanning at least 4 times a day -  before meals and at bedtime. - he is encouraged to call clinic for blood glucose levels less than 70 or above 300 mg /dl. - he is not a suitable candidate for any known insulin treatment due to his hepatic cirrhosis.    -  Specific targets for  A1c;  LDL, HDL,  and Triglycerides were discussed with the patient.  2) Blood Pressure /Hypertension:  his blood pressure is  controlled to target.  His blood pressure today is 98.68.  He is not on any particular antihypertensive medications, Lasix as needed.  3) Lipids/Hyperlipidemia: He did not have recent lipid panel to review.  He is not a suitable candidate for statin intervention for now.    4)  Weight/Diet:  Body mass index is 29.68 kg/m.  -He had unintended weight loss of approximately 75 pounds due to a course of multiple illnesses and staying in her nursing home for several weeks. -He is deconditioned, cannot exercise optimally.   detailed carbohydrates information provided  -  detailed on discharge instructions.  5) Chronic Care/Health Maintenance:  -he  Is not on ACEI/ARB and Statin medications and  is encouraged to initiate and continue to follow up with Ophthalmology, Dentist,  Podiatrist at least yearly or according to recommendations, and advised to  stay away from smoking. I have recommended yearly flu vaccine and pneumonia vaccine at least every 5 years; moderate intensity exercise for up to 150 minutes weekly; and  sleep for at least 7 hours a day.  - he is  advised to maintain close follow up with Sherrilee Gilles, DO for primary care needs, as well as his other providers for optimal and coordinated care.   - Time spent in this patient care: 65 min, of which > 50% was spent in  counseling  him about his currently uncontrolled, symptomatic, complicated, type 2 diabetes; and the rest reviewing his blood glucose logs , discussing his hypoglycemia and hyperglycemia episodes, reviewing his current and  previous labs / studies  ( including abstraction from other facilities) and medications  doses and developing a  long term treatment plan based on the latest standards of care/ guidelines; and documenting his care.    Please refer to Patient Instructions for Blood  Glucose Monitoring and Insulin/Medications Dosing Guide"  in media tab for additional information. Please  also refer to " Patient Self Inventory" in the Media  tab for reviewed elements of pertinent patient history.  Spencer Fletcher participated in the discussions, expressed understanding, and voiced agreement with the above plans.  All questions were answered to his satisfaction. he is encouraged to contact clinic should he have any questions or concerns prior to his return visit.   Follow up plan: - Return in about 10 days (around 01/08/2020) for Bring Meter and Logs-  A1c in Office.  Glade Lloyd, MD [facility] [address] [place], [place] [address] Phone: [phone]  Fax: [phone]    12/29/2019, 5:01 PM  This note was partially dictated with voice recognition software. Similar sounding words can be transcribed inadequately or may not  be corrected upon review.

## 2019-12-29 NOTE — Telephone Encounter (Signed)
Patient is calling- he states that he would like to let Dr. Christella Hartigan know that he spent the day Saturday in the hospital. Labs were drawn and he states that BUN- 31 creatinine- 1.9 Sodium 130 Calcium 5.4 Gave him 2 liters of fluid. States they told him to let hes GI doctor know to see if there was anything he needed to change.

## 2019-12-29 NOTE — Telephone Encounter (Signed)
The pt has been advised  

## 2020-01-09 ENCOUNTER — Ambulatory Visit: Payer: Medicare Other | Admitting: "Endocrinology

## 2020-02-04 ENCOUNTER — Other Ambulatory Visit (INDEPENDENT_AMBULATORY_CARE_PROVIDER_SITE_OTHER): Payer: Medicare Other

## 2020-02-04 ENCOUNTER — Encounter: Payer: Self-pay | Admitting: Gastroenterology

## 2020-02-04 ENCOUNTER — Ambulatory Visit (INDEPENDENT_AMBULATORY_CARE_PROVIDER_SITE_OTHER): Payer: Medicare Other | Admitting: Gastroenterology

## 2020-02-04 VITALS — BP 101/71 | HR 79 | Ht 69.0 in | Wt 204.2 lb

## 2020-02-04 DIAGNOSIS — K7031 Alcoholic cirrhosis of liver with ascites: Secondary | ICD-10-CM

## 2020-02-04 LAB — IBC + FERRITIN
Ferritin: 121.3 ng/mL (ref 22.0–322.0)
Iron: 83 ug/dL (ref 42–165)
Saturation Ratios: 20.7 % (ref 20.0–50.0)
Transferrin: 287 mg/dL (ref 212.0–360.0)

## 2020-02-04 LAB — COMPREHENSIVE METABOLIC PANEL
ALT: 17 U/L (ref 0–53)
AST: 19 U/L (ref 0–37)
Albumin: 4.6 g/dL (ref 3.5–5.2)
Alkaline Phosphatase: 69 U/L (ref 39–117)
BUN: 18 mg/dL (ref 6–23)
CO2: 24 mEq/L (ref 19–32)
Calcium: 9.9 mg/dL (ref 8.4–10.5)
Chloride: 99 mEq/L (ref 96–112)
Creatinine, Ser: 1.31 mg/dL (ref 0.40–1.50)
GFR: 55.96 mL/min — ABNORMAL LOW (ref >60.00)
Glucose, Bld: 432 mg/dL — ABNORMAL HIGH (ref 70–99)
Potassium: 3.8 mEq/L (ref 3.5–5.1)
Sodium: 132 mEq/L — ABNORMAL LOW (ref 135–145)
Total Bilirubin: 0.6 mg/dL (ref 0.2–1.2)
Total Protein: 8.2 g/dL (ref 6.0–8.3)

## 2020-02-04 LAB — PROTIME-INR
INR: 1.2 ratio — ABNORMAL HIGH (ref 0.8–1.0)
Prothrombin Time: 13.5 s — ABNORMAL HIGH (ref 9.6–13.1)

## 2020-02-04 LAB — CBC
HCT: 43.6 % (ref 39.0–52.0)
Hemoglobin: 14.7 g/dL (ref 13.0–17.0)
MCHC: 33.6 g/dL (ref 30.0–36.0)
MCV: 83.5 fl (ref 78.0–100.0)
Platelets: 117 10*3/uL — ABNORMAL LOW (ref 150.0–400.0)
RBC: 5.22 Mil/uL (ref 4.22–5.81)
RDW: 14 % (ref 11.5–15.5)
WBC: 7.2 10*3/uL (ref 4.0–10.5)

## 2020-02-04 NOTE — Progress Notes (Signed)
Review of pertinent gastrointestinal problems: 1.  Routine risk for colon cancer.  Colonoscopy April 2021 found no polyps or cancers.  He did have prominent rectal vessels which may be portal hypertensive related. 2.  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  Prolonged hospital stay including several weeks in [place] facility while intubated.  Temporary G tube feeding.  Presented to [facility] to establish care for 5 days after [facility] discharge.  MELD-Na 11 (11/2019 labs)  Liver imaging: MRI April 2021 showed cirrhosis, portal hypertension, no focal tumors in liver.  Minimal ascites.  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  Nonspecific gastritis which was H. pylori negative on biopsy.  Propranolol 29m tid  HPI: This is a very pleasant 59year old man whom I last saw the time of colonoscopy and upper endoscopy.  See those results summarized above.  Since then he started propranolol 10 mg 3 times daily.  He did feel a bit lightheaded after starting them that seems to have evened out.  He feels well overall.  He is eating well.  He is trying to follow a low-salt diet.  He had a cardiac ultrasound yesterday and he is having an angiogram in the near future.  This is under the direction of [facility].  His initial presentation several months ago was cardiac arrest.  His weight is up 4 pounds since his last office visit here 2 months ago, same scale.  He has 1-2 loose stools daily.  He is on MiraLAX and lactulose.  He is on Reglan he does not know why he is on that.  Last drink still several months ago.  ROS: complete GI ROS as described in HPI, all other review negative.  Constitutional:  No unintentional weight loss   Past Medical History:  Diagnosis Date    Diabetes mellitus without complication [place]    Sleep apnea     Past Surgical History:  Procedure Laterality Date   BIOPSY  12/18/2019   Procedure: BIOPSY;  Surgeon: JMilus Banister MD;  Location: [facility];  Service: Endoscopy;;   COLONOSCOPY WITH PROPOFOL N/A 12/18/2019   Procedure: COLONOSCOPY WITH PROPOFOL;  Surgeon: JMilus Banister MD;  Location: [facility];  Service: Endoscopy;  Laterality: N/A;   ESOPHAGOGASTRODUODENOSCOPY (EGD) WITH PROPOFOL N/A 12/18/2019   Procedure: ESOPHAGOGASTRODUODENOSCOPY (EGD) WITH PROPOFOL;  Surgeon: JMilus Banister MD;  Location: [facility];  Service: Endoscopy;  Laterality: N/A;    Current Outpatient Medications  Medication Sig Dispense Refill   buPROPion (WELLBUTRIN XL) 300 MG 24 hr tablet Take 300 mg by mouth daily.     citalopram (CELEXA) 10 MG tablet Take 10 mg by mouth at bedtime.      clonazePAM (KLONOPIN) 0.5 MG tablet Take 0.5 mg by mouth at bedtime.      docusate sodium (COLACE) 100 MG capsule Take 100 mg by mouth 2 (two) times daily.     folic acid (FOLVITE) 1 MG tablet Take 1 mg by mouth daily.     furosemide (LASIX) 40 MG tablet Take 1 tablet (40 mg total) by mouth daily. 30 tablet 11   Glucagon, rDNA, (GLUCAGON EMERGENCY) 1 MG KIT Inject 1 mg into the muscle as needed for low blood sugar.     insulin glargine, 2 Unit Dial, (TOUJEO MAX SOLOSTAR) 300 UNIT/ML Solostar Pen Inject  50 Units into the skin at bedtime. 2 pen 2   insulin lispro (HUMALOG) 100 UNIT/ML injection Inject into the skin 3 (three) times daily before meals.     lactulose (CHRONULAC) 10 GM/15ML solution Take 40 g by mouth in the morning and at bedtime.      metoCLOPramide (REGLAN) 10 MG tablet Take 10 mg by mouth in the morning, at noon, and at bedtime.     naloxone (NARCAN) 4 MG/0.1ML LIQD nasal spray kit Place 1 spray into the nose as needed (accidental overdose.).      pantoprazole (PROTONIX) 40 MG tablet Take 40 mg by mouth daily.      polyethylene glycol (MIRALAX / GLYCOLAX) 17 g packet Take 17 g by mouth daily.     pregabalin (LYRICA) 200 MG capsule Take 200 mg by mouth in the morning, at noon, and at bedtime.      propranolol (INDERAL) 10 MG tablet Take 1 tablet (10 mg total) by mouth 3 (three) times daily. 90 tablet 11   rifaximin (XIFAXAN) 550 MG TABS tablet Take 550 mg by mouth 2 (two) times daily.     thiamine (VITAMIN B-1) 100 MG tablet Take 100 mg by mouth daily.     No current facility-administered medications for this visit.    Allergies as of 02/04/2020 - Review Complete 02/04/2020  Allergen Reaction Noted   Ambien [zolpidem] Other (See Comments) 12/03/2019   Carafate [sucralfate] Hives and Itching 12/03/2019    Family History  Problem Relation Age of Onset   Breast cancer Mother    Diabetes Father    Heart disease Father     Social History   Socioeconomic History   Marital status: Married    Spouse name: Not on file   Number of children: Not on file   Years of education: Not on file   Highest education level: Not on file  Occupational History   Not on file  Tobacco Use   Smoking status: Former Smoker    Types: Cigarettes    Quit date: 08/22/2019    Years since quitting: 0.4   Smokeless tobacco: Never Used  Vaping Use   Vaping Use: Some days   Substances: Flavoring   Devices: once every 2 weeks  Substance and Sexual Activity   Alcohol use: Not Currently   Drug use: Not Currently   Sexual activity: Not on file  Other Topics Concern   Not on file  Social History Narrative   Not on file   Social Determinants of Health   Financial Resource Strain:    Difficulty of Paying Living Expenses:   Food Insecurity:    Worried About Charity fundraiser in the Last Year:    Arboriculturist in the Last Year:   Transportation Needs:    Film/video editor (Medical):    Lack of Transportation (Non-Medical):   Physical Activity:    Days of Exercise per Week:     Minutes of Exercise per Session:   Stress:    Feeling of Stress :   Social Connections:    Frequency of Communication with Friends and Family:    Frequency of Social Gatherings with Friends and Family:    Attends Religious Services:    Active Member of Clubs or Organizations:    Attends Archivist Meetings:    Marital Status:   Intimate Partner Violence:    Fear of Current or Ex-Partner:    Emotionally Abused:    Physically Abused:  Sexually Abused:      Physical Exam: Ht 5' 9"  (1.753 m)    Wt 204 lb 4 oz (92.6 kg)    BMI 30.16 kg/m   BP 101/71    Pulse 79    Ht 5' 9"  (1.753 m)    Wt 204 lb 4 oz (92.6 kg)    BMI 30.16 kg/m   Constitutional: Weak, walks with a cane Psychiatric: alert and oriented x3 Abdomen: soft, nontender, nondistended, no obvious ascites, no peritoneal signs, normal bowel sounds No peripheral edema noted in lower extremities  Assessment and plan: 59 y.o. male with decompensated cirrhosis  Meld score 11 as of 2 months ago.  He clearly does not have ascites now and no lower extremity edema.  He is on Lasix 40 mg once daily only.  Not sure why he is on Reglan, he is not sure either.  He is going to stop it for now.  I do think he needs to be on both lactulose and MiraLAX and so he is going to stop taking the MiraLAX for now.  I do not know that he has ever had testing for other potential causes of his cirrhosis and so I am going to order a battery of blood tests, see that summarized in his AVS.  He also needs repeat meld calculation.  I would like to check his creatinine as well as his electrolyte status.  May need to adjust his diuretics.  Certainly could go down on his Lasix clinically if needed.  He is having cardiac work-up in [place], angiogram in the near future.  He will return to see me in 2 months.    Depending on his heart rate, blood pressure trends I might increase his propranolol.  Please see the "Patient Instructions"  section for addition details about the plan.  Owens Loffler, MD [facility] 02/04/2020, 1:34 PM   Total time on date of encounter was 30 minutes (this included time spent preparing to see the patient reviewing records; obtaining and/or reviewing separately obtained history; performing a medically appropriate exam and/or evaluation; counseling and educating the patient and family if present; ordering medications, tests or procedures if applicable; and documenting clinical information in the health record).

## 2020-02-04 NOTE — Patient Instructions (Addendum)
If you are age 59 or older, your body mass index should be between 23-30. Your Body mass index is 30.16 kg/m. If this is out of the aforementioned range listed, please consider follow up with your Primary Care Provider.  If you are age 7 or younger, your body mass index should be between 19-25. Your Body mass index is 30.16 kg/m. If this is out of the aformentioned range listed, please consider follow up with your Primary Care Provider.   Your provider has requested that you go to the basement level for lab work before leaving today. Press "B" on the elevator. The lab is located at the first door on the left as you exit the elevator.  Due to recent changes in healthcare laws, you may see the results of your imaging and laboratory studies on MyChart before your provider has had a chance to review them.  We understand that in some cases there may be results that are confusing or concerning to you. Not all laboratory results come back in the same time frame and the provider may be waiting for multiple results in order to interpret others.  Please give [place] 48 hours in order for your provider to thoroughly review all the results before contacting the office for clarification of your results.   STOP: Reglan STOP: Miralax  Please follow low sodium diet.  You will be seen in the office for a follow up appointment on 04/06/20 at 2:30pm.  Thank you for entrusting me with your care and choosing [facility].  Dr Christella Hartigan

## 2020-02-05 ENCOUNTER — Telehealth: Payer: Self-pay | Admitting: Gastroenterology

## 2020-02-05 NOTE — Telephone Encounter (Signed)
Spoke with patient, patient wanting to know if it is okay for him to drink protein shakes for weight gain with cirrhosis of the liver? Please advise, thank you.

## 2020-02-05 NOTE — Telephone Encounter (Signed)
Left detailed message for patient. Advised to call office with any concerns.

## 2020-02-05 NOTE — Telephone Encounter (Signed)
Yes, that is ok 

## 2020-02-05 NOTE — Telephone Encounter (Signed)
Patient called would like to know if he can drink protein shakes to help gain weight or not please advise.

## 2020-02-06 ENCOUNTER — Ambulatory Visit: Payer: Medicare Other | Admitting: "Endocrinology

## 2020-02-10 LAB — HEPATITIS B SURFACE ANTIBODY,QUALITATIVE: Hep B S Ab: REACTIVE — AB

## 2020-02-10 LAB — TIQ-MISC

## 2020-02-10 LAB — HEPATITIS C ANTIBODY
Hepatitis C Ab: NONREACTIVE
SIGNAL TO CUT-OFF: 0.01 (ref <1.00)

## 2020-02-10 LAB — HEPATITIS A ANTIBODY, TOTAL: Hepatitis A AB,Total: NONREACTIVE

## 2020-02-10 LAB — HEPATITIS B SURFACE ANTIGEN: Hepatitis B Surface Ag: NONREACTIVE

## 2020-02-10 LAB — ANTI-SMOOTH MUSCLE ANTIBODY, IGG: Actin (Smooth Muscle) Antibody (IGG): <20 U (ref <20)

## 2020-02-10 LAB — ANA: Anti Nuclear Antibody (ANA): NEGATIVE

## 2020-02-13 ENCOUNTER — Other Ambulatory Visit: Payer: Self-pay

## 2020-02-13 DIAGNOSIS — K7031 Alcoholic cirrhosis of liver with ascites: Secondary | ICD-10-CM

## 2020-02-19 ENCOUNTER — Telehealth: Payer: Self-pay | Admitting: Gastroenterology

## 2020-02-19 NOTE — Telephone Encounter (Signed)
Pt called to inform that he was at the ED at [facility] in [place], [place] yesterday. He was put on a BT and pt wants to know if that can affect his liver. Pls call him.

## 2020-02-19 NOTE — Telephone Encounter (Signed)
I spoke with the pt and he asked me to call his wife at  [phone]. Left message on machine to call back

## 2020-02-20 NOTE — Telephone Encounter (Signed)
It should be okay.

## 2020-02-20 NOTE — Telephone Encounter (Signed)
The patient has been notified of this information and all questions answered.

## 2020-02-20 NOTE — Telephone Encounter (Signed)
Left message on machine to call back  

## 2020-02-20 NOTE — Telephone Encounter (Signed)
The pt was put on Brilinta yesterday after cardiac cath with stents placed.  He want to make sure this is not going to interfere with his liver.

## 2020-03-04 ENCOUNTER — Ambulatory Visit: Payer: Medicare Other | Admitting: Nutrition

## 2020-03-08 ENCOUNTER — Telehealth: Payer: Self-pay | Admitting: "Endocrinology

## 2020-03-08 NOTE — Telephone Encounter (Signed)
Returned call, advised of last office visit instructions.

## 2020-03-08 NOTE — Telephone Encounter (Signed)
Patient's wife left a VM that he is in the hospital and they are needing to know his insulin dosage. She is asking for a call back ASAP, [phone]

## 2020-03-10 ENCOUNTER — Encounter: Payer: Self-pay | Admitting: "Endocrinology

## 2020-03-10 ENCOUNTER — Other Ambulatory Visit: Payer: Self-pay

## 2020-03-10 ENCOUNTER — Ambulatory Visit (INDEPENDENT_AMBULATORY_CARE_PROVIDER_SITE_OTHER): Payer: Medicare Other | Admitting: "Endocrinology

## 2020-03-10 VITALS — BP 98/70 | HR 52 | Ht 69.0 in | Wt 203.6 lb

## 2020-03-10 DIAGNOSIS — E1159 Type 2 diabetes mellitus with other circulatory complications: Secondary | ICD-10-CM

## 2020-03-10 LAB — POCT GLYCOSYLATED HEMOGLOBIN (HGB A1C): Hemoglobin A1C: 12.4 % — AB (ref 4.0–5.6)

## 2020-03-10 MED ORDER — TOUJEO MAX SOLOSTAR 300 UNIT/ML ~~LOC~~ SOPN: 60.0000 [IU] | PEN_INJECTOR | Freq: Every day | SUBCUTANEOUS | 2 refills | Status: DC

## 2020-03-10 NOTE — Progress Notes (Signed)
03/10/2020, 3:45 PM   Endocrinology follow-up note  Subjective:    Patient ID: Spencer Fletcher, male    DOB: 1960-09-11.  Spencer Fletcher is being seen in follow-up after he was seen in consultation for management of currently uncontrolled symptomatic diabetes requested by  Sherrilee Gilles, DO.   Past Medical History:  Diagnosis Date  . Diabetes mellitus without complication ([place])   . Sleep apnea     Past Surgical History:  Procedure Laterality Date  . BIOPSY  12/18/2019   Procedure: BIOPSY;  Surgeon: Milus Banister, MD;  Location: [facility];  Service: Endoscopy;;  . COLONOSCOPY WITH PROPOFOL N/A 12/18/2019   Procedure: COLONOSCOPY WITH PROPOFOL;  Surgeon: Milus Banister, MD;  Location: [facility];  Service: Endoscopy;  Laterality: N/A;  . ESOPHAGOGASTRODUODENOSCOPY (EGD) WITH PROPOFOL N/A 12/18/2019   Procedure: ESOPHAGOGASTRODUODENOSCOPY (EGD) WITH PROPOFOL;  Surgeon: Milus Banister, MD;  Location: [facility];  Service: Endoscopy;  Laterality: N/A;    Social History   Socioeconomic History  . Marital status: Married    Spouse name: Not on file  . Number of children: Not on file  . Years of education: Not on file  . Highest education level: Not on file  Occupational History  . Not on file  Tobacco Use  . Smoking status: Former Smoker    Types: Cigarettes    Quit date: 08/22/2019    Years since quitting: 0.5  . Smokeless tobacco: Never Used  Vaping Use  . Vaping Use: Some days  . Substances: Flavoring  . Devices: once every 2 weeks  Substance and Sexual Activity  . Alcohol use: Not Currently  . Drug use: Not Currently  . Sexual activity: Not on file  Other Topics Concern  . Not on file  Social History Narrative  . Not on file   Social Determinants of Health   Financial Resource Strain:   . Difficulty of Paying Living Expenses:   Food Insecurity:   . Worried About  Charity fundraiser in the Last Year:   . Arboriculturist in the Last Year:   Transportation Needs:   . Film/video editor (Medical):   Marland Kitchen Lack of Transportation (Non-Medical):   Physical Activity:   . Days of Exercise per Week:   . Minutes of Exercise per Session:   Stress:   . Feeling of Stress :   Social Connections:   . Frequency of Communication with Friends and Family:   . Frequency of Social Gatherings with Friends and Family:   . Attends Religious Services:   . Active Member of Clubs or Organizations:   . Attends Archivist Meetings:   Marland Kitchen Marital Status:     Family History  Problem Relation Age of Onset  . Breast cancer Mother   . Diabetes Father   . Heart disease Father     Outpatient Encounter Medications as of 03/10/2020  Medication Sig  . buPROPion (WELLBUTRIN XL) 300 MG 24 hr tablet Take 300 mg by mouth daily.  . citalopram (CELEXA) 10 MG tablet Take 10 mg by mouth at bedtime.   Marland Kitchen  clonazePAM (KLONOPIN) 0.5 MG tablet Take 0.5 mg by mouth at bedtime.   . docusate sodium (COLACE) 100 MG capsule Take 100 mg by mouth 2 (two) times daily.  . folic acid (FOLVITE) 1 MG tablet Take 1 mg by mouth daily.  . furosemide (LASIX) 40 MG tablet Take 1 tablet (40 mg total) by mouth daily.  . Glucagon, rDNA, (GLUCAGON EMERGENCY) 1 MG KIT Inject 1 mg into the muscle as needed for low blood sugar.  . insulin glargine, 2 Unit Dial, (TOUJEO MAX SOLOSTAR) 300 UNIT/ML Solostar Pen Inject 60 Units into the skin at bedtime.  . insulin lispro (HUMALOG) 100 UNIT/ML injection Inject into the skin 3 (three) times daily before meals.  . naloxone (NARCAN) 4 MG/0.1ML LIQD nasal spray kit Place 1 spray into the nose as needed (accidental overdose.).   Marland Kitchen pantoprazole (PROTONIX) 40 MG tablet Take 40 mg by mouth daily.  . pregabalin (LYRICA) 200 MG capsule Take 200 mg by mouth in the morning, at noon, and at bedtime.   . propranolol (INDERAL) 10 MG tablet Take 1 tablet (10 mg total) by  mouth 3 (three) times daily.  . rifaximin (XIFAXAN) 550 MG TABS tablet Take 550 mg by mouth 2 (two) times daily.  Marland Kitchen thiamine (VITAMIN B-1) 100 MG tablet Take 100 mg by mouth daily.  . [DISCONTINUED] insulin glargine, 2 Unit Dial, (TOUJEO MAX SOLOSTAR) 300 UNIT/ML Solostar Pen Inject 50 Units into the skin at bedtime.   No facility-administered encounter medications on file as of 03/10/2020.    ALLERGIES: Allergies  Allergen Reactions  . Ambien [Zolpidem] Other (See Comments)  . Carafate [Sucralfate] Hives and Itching  . Lexapro [Escitalopram Oxalate]     VACCINATION STATUS:  There is no immunization history on file for this patient.  Diabetes He presents for his follow-up diabetic visit. He has type 2 diabetes mellitus. Onset time: He was diagnosed at proximate age of 59 years. His disease course has been worsening. There are no hypoglycemic associated symptoms. Pertinent negatives for hypoglycemia include no confusion, headaches, pallor or seizures. Associated symptoms include blurred vision, polydipsia and polyuria. Pertinent negatives for diabetes include no chest pain, no fatigue, no polyphagia and no weakness. There are no hypoglycemic complications. Symptoms are worsening. Diabetic complications include heart disease. (Patient has multiple complications including cardiac arrest x2, peripheral neuropathy, polypharmacy, recurrent pneumonia.  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 ) Risk factors for coronary artery disease include diabetes mellitus, male sex, obesity, sedentary lifestyle and tobacco exposure. When asked about current treatments, none (This patient took a large dose of insulin in the past, however he has not taken any medications for the last 4 weeks after he was discharged from hospital where he was admitted for cardiac arrest, which required admission in rehab at [facility].) were reported. His weight is stable (He gives history of weighing as high  as 290 pounds, however recently stable around 200 pounds.). He is following a generally unhealthy diet. When asked about meal planning, he reported none. He has not had a previous visit with a dietitian. He never participates in exercise. His home blood glucose trend is increasing steadily. His breakfast blood glucose range is generally >200 mg/dl. His lunch blood glucose range is generally >200 mg/dl. His dinner blood glucose range is generally >200 mg/dl. His bedtime blood glucose range is generally >200 mg/dl. His overall blood glucose range is >200 mg/dl. (He did not bring any meter nor insulin administration logs.  He uses his  Dexcom CGM device which showed average blood glucose of 307-396 for the last 90 days.  His point-of-care A1c is 12.4% today worsening from 8.8%.  He did not have any hypoglycemia  ) An ACE inhibitor/angiotensin II receptor blocker is not being taken. Eye exam is current.     Review of Systems  Constitutional: Negative for chills, fatigue, fever and unexpected weight change.  HENT: Negative for dental problem, mouth sores and trouble swallowing.   Eyes: Positive for blurred vision. Negative for visual disturbance.  Respiratory: Negative for cough, choking, chest tightness, shortness of breath and wheezing.   Cardiovascular: Negative for chest pain, palpitations and leg swelling.  Gastrointestinal: Negative for abdominal distention, abdominal pain, constipation, diarrhea, nausea and vomiting.  Endocrine: Positive for polydipsia and polyuria. Negative for polyphagia.  Genitourinary: Negative for dysuria, flank pain, hematuria and urgency.  Musculoskeletal: Positive for arthralgias, back pain, gait problem and myalgias. Negative for neck pain.  Skin: Negative for pallor, rash and wound.  Neurological: Negative for seizures, syncope, weakness, numbness and headaches.  Psychiatric/Behavioral: Negative for confusion and dysphoric mood.    Objective:    Vitals with BMI  03/10/2020 02/04/2020 12/29/2019  Height 5' 9"  5' 9"  5' 9"   Weight 203 lbs 10 oz 204 lbs 4 oz 201 lbs  BMI 30.05 74.08 14.48  Systolic 98 185 98  Diastolic 70 71 68  Pulse 52 79 56    BP 98/70   Pulse (!) 52   Ht 5' 9"  (1.753 m)   Wt 203 lb 9.6 oz (92.4 kg)   BMI 30.07 kg/m   Wt Readings from Last 3 Encounters:  03/10/20 203 lb 9.6 oz (92.4 kg)  02/04/20 204 lb 4 oz (92.6 kg)  12/29/19 201 lb (91.2 kg)     Physical Exam Constitutional:      General: He is not in acute distress.    Appearance: He is well-developed.  HENT:     Head: Normocephalic and atraumatic.  Neck:     Thyroid: No thyromegaly.     Trachea: No tracheal deviation.  Cardiovascular:     Rate and Rhythm: Normal rate.     Pulses:          Dorsalis pedis pulses are 1+ on the right side and 1+ on the left side.       Posterior tibial pulses are 1+ on the right side and 1+ on the left side.     Heart sounds: S1 normal and S2 normal. No murmur heard.  No gallop.   Pulmonary:     Effort: Pulmonary effort is normal. No respiratory distress.     Breath sounds: No wheezing.  Abdominal:     General: There is no distension.     Tenderness: There is no abdominal tenderness. There is no guarding.  Musculoskeletal:     Right shoulder: No swelling or deformity.     Cervical back: Normal range of motion and neck supple.     Comments: global loss of skeletal muscle mass.  Skin:    General: Skin is warm and dry.     Findings: No rash.     Nails: There is no clubbing.  Neurological:     Mental Status: He is alert and oriented to person, place, and time.     Cranial Nerves: No cranial nerve deficit.     Sensory: No sensory deficit.     Gait: Gait normal.     Deep Tendon Reflexes: Reflexes are normal and symmetric.  Psychiatric:  Speech: Speech normal.        Behavior: Behavior normal. Behavior is cooperative.        Thought Content: Thought content normal.        Judgment: Judgment normal.     CMP ( most  recent) CMP     Component Value Date/Time   NA 132 (L) 02/04/2020 1415   K 3.8 02/04/2020 1415   CL 99 02/04/2020 1415   CO2 24 02/04/2020 1415   GLUCOSE 432 (H) 02/04/2020 1415   BUN 18 02/04/2020 1415   CREATININE 1.31 02/04/2020 1415   CALCIUM 9.9 02/04/2020 1415   PROT 8.2 02/04/2020 1415   ALBUMIN 4.6 02/04/2020 1415   AST 19 02/04/2020 1415   ALT 17 02/04/2020 1415   ALKPHOS 69 02/04/2020 1415   BILITOT 0.6 02/04/2020 1415     Diabetic Labs (most recent): Lab Results  Component Value Date   HGBA1C 12.4 (A) 03/10/2020   HGBA1C 8.8 02/19/2019     Assessment & Plan:   1. Type 2 diabetes mellitus with other circulatory complication, without long-term current use of insulin ([place])   - Spencer Fletcher has currently uncontrolled symptomatic type 2 DM since  59 years of age.  He did not bring any meter nor insulin administration logs.  He uses his Dexcom CGM device which showed average blood glucose of 307-396 for the last 90 days.  His point-of-care A1c is 12.4% today worsening from 8.8%.  He did not have any hypoglycemia  - Recent labs reviewed. - I had a long discussion with him about the progressive nature of diabetes and the pathology behind its complications. -his diabetes is complicated by coronary artery disease, peripheral arterial disease, peripheral neuropathy, obesity, hepatic cirrhosis and he remains at a high risk for more acute and chronic complications which include CAD, CVA, CKD, retinopathy, and neuropathy. These are all discussed in detail with him.  - I have counseled him on diet  and weight management  by adopting a carbohydrate restricted/protein rich diet. Patient is encouraged to switch to  unprocessed or minimally processed     complex starch and increased protein intake (animal or plant source), fruits, and vegetables. -  he is advised to stick to a routine mealtimes to eat 3 meals  a day and avoid unnecessary snacks ( to snack only to correct  hypoglycemia).   - he  admits there is a room for improvement in his diet and drink choices. -  Suggestion is made for him to avoid simple carbohydrates  from his diet including Cakes, Sweet Desserts / Pastries, Ice Cream, Soda (diet and regular), Sweet Tea, Candies, Chips, Cookies, Sweet Pastries,  Store Bought Juices, Alcohol in Excess of  1-2 drinks a day, Artificial Sweeteners, Coffee Creamer, and "Sugar-free" Products. This will help patient to have stable blood glucose profile and potentially avoid unintended weight gain.   - he will be scheduled with Jearld Fenton, RDN, CDE for diabetes education.  - I have approached him with the following individualized plan to manage  his diabetes and patient agrees:   -Based on his current and prevailing glycemic burden, this patient will need treatment with higher dose of basal/bolus insulin.   -For this to happen, he has to commit for proper documentation of his insulin administration.   He has history of using Toujeo 112 units daily as well as NovoLog up to 65 units  3 times a day.  However he has not taken any treatment for the last  3 to 4 weeks.  He has lost close to 75 pounds over the last year. -Accordingly, he is advised to increase his Toujeo to 60 units nightly, resume and continue  NovoLog 10-16 units 3 times daily AC for Premeal blood glucose readings greater than 90 mg per DL, associated with consistent use of his CGM device-scanning at least 4 times a day -  before meals and at bedtime. - he is encouraged to call clinic for blood glucose levels less than 70 or above 300 mg /dl. - he is not a suitable candidate for any known insulin treatment due to his hepatic cirrhosis.    - Specific targets for  A1c;  LDL, HDL,  and Triglycerides were discussed with the patient.  2) Blood Pressure /Hypertension: His blood pressure is controlled to target.  His blood pressure today is 98/70.  He is not on any particular antihypertensive medications,  Lasix as needed.  3) Lipids/Hyperlipidemia: He did not have recent lipid panel to review.  He is not a suitable candidate for statin intervention for now.    4)  Weight/Diet:  Body mass index is 30.07 kg/m.  -He had unintended weight loss of approximately 75 pounds due to a course of multiple illnesses and staying in a nursing home for several weeks. -He is deconditioned, cannot exercise optimally.   detailed carbohydrates information provided  -  detailed on discharge instructions.  5) Chronic Care/Health Maintenance:  -he  Is not on ACEI/ARB and Statin medications and  is encouraged to initiate and continue to follow up with Ophthalmology, Dentist,  Podiatrist at least yearly or according to recommendations, and advised to  stay away from smoking. I have recommended yearly flu vaccine and pneumonia vaccine at least every 5 years; moderate intensity exercise for up to 150 minutes weekly; and  sleep for at least 7 hours a day.  - he is  advised to maintain close follow up with Sherrilee Gilles, DO for primary care needs, as well as his other providers for optimal and coordinated care.   - Time spent on this patient care encounter:  45 min, of which > 50% was spent in  counseling and the rest reviewing his blood glucose logs , discussing his hypoglycemia and hyperglycemia episodes, reviewing his current and  previous labs / studies  ( including abstraction from other facilities) and medications  doses and developing a  long term treatment plan and documenting his care.   Please refer to Patient Instructions for Blood Glucose Monitoring and Insulin/Medications Dosing Guide"  in media tab for additional information. Please  also refer to " Patient Self Inventory" in the Media  tab for reviewed elements of pertinent patient history.  Spencer Fletcher participated in the discussions, expressed understanding, and voiced agreement with the above plans.  All questions were answered to his satisfaction. he is  encouraged to contact clinic should he have any questions or concerns prior to his return visit.    Follow up plan: - Return in about 2 weeks (around 03/24/2020) for F/U with Meter and Logs Only - no Labs.  Glade Lloyd, MD [facility] [address] [place], [place] [address] Phone: [phone]  Fax: [phone]    03/10/2020, 3:45 PM  This note was partially dictated with voice recognition software. Similar sounding words can be transcribed inadequately or may not  be corrected upon review.

## 2020-03-10 NOTE — Patient Instructions (Signed)

## 2020-03-29 ENCOUNTER — Telehealth: Payer: Self-pay | Admitting: Gastroenterology

## 2020-03-29 NOTE — Telephone Encounter (Signed)
The pt wife called due to the pt having a very low BP.  She states he has an appt with Dr Christella Hartigan for next week and to discuss cirrhosis and med follow up.  I advised her to call PCP regarding low BP and fatigue.  The pt has been advised of the information and verbalized understanding. She will have him go to the ED if he has worse symptoms, mental health changes, or any further concerns.  He is due for for labs and she will have him come in this week.

## 2020-03-30 ENCOUNTER — Other Ambulatory Visit (INDEPENDENT_AMBULATORY_CARE_PROVIDER_SITE_OTHER): Payer: Medicare Other

## 2020-03-30 DIAGNOSIS — K7031 Alcoholic cirrhosis of liver with ascites: Secondary | ICD-10-CM

## 2020-03-30 LAB — PROTIME-INR
INR: 1.2 ratio — ABNORMAL HIGH (ref 0.8–1.0)
Prothrombin Time: 13.2 s — ABNORMAL HIGH (ref 9.6–13.1)

## 2020-03-30 LAB — CBC WITH DIFFERENTIAL/PLATELET
Basophils Absolute: 0.1 10*3/uL (ref 0.0–0.1)
Basophils Relative: 1.1 % (ref 0.0–3.0)
Eosinophils Absolute: 0.4 10*3/uL (ref 0.0–0.7)
Eosinophils Relative: 6.2 % — ABNORMAL HIGH (ref 0.0–5.0)
HCT: 43.4 % (ref 39.0–52.0)
Hemoglobin: 14.5 g/dL (ref 13.0–17.0)
Lymphocytes Relative: 27.7 % (ref 12.0–46.0)
Lymphs Abs: 1.9 10*3/uL (ref 0.7–4.0)
MCHC: 33.5 g/dL (ref 30.0–36.0)
MCV: 83 fl (ref 78.0–100.0)
Monocytes Absolute: 0.4 10*3/uL (ref 0.1–1.0)
Monocytes Relative: 5.9 % (ref 3.0–12.0)
Neutro Abs: 4 10*3/uL (ref 1.4–7.7)
Neutrophils Relative %: 59.1 % (ref 43.0–77.0)
Platelets: 87 10*3/uL — ABNORMAL LOW (ref 150.0–400.0)
RBC: 5.22 Mil/uL (ref 4.22–5.81)
RDW: 14 % (ref 11.5–15.5)
WBC: 6.7 10*3/uL (ref 4.0–10.5)

## 2020-03-30 LAB — COMPREHENSIVE METABOLIC PANEL
ALT: 40 U/L (ref 0–53)
AST: 38 U/L — ABNORMAL HIGH (ref 0–37)
Albumin: 4.7 g/dL (ref 3.5–5.2)
Alkaline Phosphatase: 65 U/L (ref 39–117)
BUN: 17 mg/dL (ref 6–23)
CO2: 25 mEq/L (ref 19–32)
Calcium: 10.4 mg/dL (ref 8.4–10.5)
Chloride: 104 mEq/L (ref 96–112)
Creatinine, Ser: 1.68 mg/dL — ABNORMAL HIGH (ref 0.40–1.50)
GFR: 41.97 mL/min — ABNORMAL LOW (ref >60.00)
Glucose, Bld: 163 mg/dL — ABNORMAL HIGH (ref 70–99)
Potassium: 4.4 mEq/L (ref 3.5–5.1)
Sodium: 140 mEq/L (ref 135–145)
Total Bilirubin: 0.6 mg/dL (ref 0.2–1.2)
Total Protein: 8.5 g/dL — ABNORMAL HIGH (ref 6.0–8.3)

## 2020-03-31 ENCOUNTER — Ambulatory Visit: Payer: Medicare Other | Admitting: "Endocrinology

## 2020-04-06 ENCOUNTER — Ambulatory Visit (INDEPENDENT_AMBULATORY_CARE_PROVIDER_SITE_OTHER): Payer: Medicare Other | Admitting: Gastroenterology

## 2020-04-06 ENCOUNTER — Encounter: Payer: Self-pay | Admitting: Gastroenterology

## 2020-04-06 VITALS — BP 100/66 | HR 68 | Ht 69.0 in | Wt 209.0 lb

## 2020-04-06 DIAGNOSIS — K7031 Alcoholic cirrhosis of liver with ascites: Secondary | ICD-10-CM

## 2020-04-06 NOTE — Progress Notes (Signed)
Review of pertinent gastrointestinal problems: 1.  Routine risk for colon cancer.  Colonoscopy April 2021 found no polyps or cancers.  He did have prominent rectal vessels which may be portal hypertensive related. 2.  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  Prolonged hospital stay including several weeks in [place] facility while intubated.  Temporary G tube feeding.  Presented to [facility] to establish care for 5 days after [facility] discharge.  MELD-Na 13 (03/2020 labs)  Liver imaging: MRI April 2021 showed cirrhosis, portal hypertension, no focal tumors in liver.  Minimal ascites.  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  Nonspecific gastritis which was H. pylori negative on biopsy.  Propranolol 39m tid   HPI: This is a very pleasant 59year old man whom I last saw about 8 weeks ago  Blood work last week showed creatinine up a bit to 1.68 from 1.31, AST 38, ALT 40, total bilirubin 0.6, INR 1.2, hemoglobin 14.5, platelets 87  He is actually taking Lasix 20 mg once daily.  His cardiologist decreased his dose about 3 days ago.  Also he completely stopped taking lactulose about 3 or 4 weeks ago.  He has noticed no issues with mental status changes, confusion.  Since I saw him he underwent coronary angiography and stenting, he is now on Brilinta  He has not resumed alcohol drinking however he did ask if he can have a single mixed drink when he goes out to dinner with his wife  ROS: complete GI ROS as described in HPI, all other review negative.  Constitutional:  No unintentional weight loss   Past Medical History:  Diagnosis Date  . Diabetes mellitus without complication ([place]   . Sleep apnea     Past Surgical History:  Procedure Laterality Date  . BIOPSY  12/18/2019    Procedure: BIOPSY;  Surgeon: JMilus Banister MD;  [facility];  Service: Endoscopy;;  . CARDIAC CATHETERIZATION    . COLONOSCOPY WITH PROPOFOL N/A 12/18/2019   Procedure: COLONOSCOPY WITH PROPOFOL;  Surgeon: JMilus Banister MD;  [facility];  Service: Endoscopy;  Laterality: N/A;  . ESOPHAGOGASTRODUODENOSCOPY (EGD) WITH PROPOFOL N/A 12/18/2019   Procedure: ESOPHAGOGASTRODUODENOSCOPY (EGD) WITH PROPOFOL;  Surgeon: JMilus Banister MD;  [facility];  Service: Endoscopy;  Laterality: N/A;    Current Outpatient Medications  Medication Sig Dispense Refill  . BRILINTA 90 MG TABS tablet Take 90 mg by mouth 2 (two) times daily.    .Marland KitchenbuPROPion (WELLBUTRIN XL) 300 MG 24 hr tablet Take 300 mg by mouth daily.    . citalopram (CELEXA) 10 MG tablet Take 10 mg by mouth at bedtime.     . clonazePAM (KLONOPIN) 0.5 MG tablet Take 0.5 mg by mouth at bedtime.     . folic acid (FOLVITE) 1 MG tablet Take 1 mg by mouth daily.    . furosemide (LASIX) 40 MG tablet Take 1 tablet (40 mg total) by mouth daily. 30 tablet 11  . insulin glargine, 2 Unit Dial, (TOUJEO MAX SOLOSTAR) 300 UNIT/ML Solostar Pen Inject 60 Units into the skin at bedtime. 2 pen 2  . insulin lispro (HUMALOG) 100 UNIT/ML injection Inject into the skin 3 (three) times daily before meals.    . lactulose (CHRONULAC) 10 GM/15ML solution Take 20 g by mouth 2 (two) times daily.    .Marland Kitchen  naloxone (NARCAN) 4 MG/0.1ML LIQD nasal spray kit Place 1 spray into the nose as needed (accidental overdose.).     Marland Kitchen pantoprazole (PROTONIX) 40 MG tablet Take 40 mg by mouth daily.    . pregabalin (LYRICA) 200 MG capsule Take 200 mg by mouth in the morning, at noon, and at bedtime.     . propranolol (INDERAL) 10 MG tablet Take 1 tablet (10 mg total) by mouth 3 (three) times daily. 90 tablet 11  . rifaximin (XIFAXAN) 550 MG TABS tablet Take 550 mg by mouth 2 (two) times daily.    Marland Kitchen thiamine (VITAMIN B-1) 100 MG tablet Take 100 mg by mouth  daily.    Marland Kitchen XTAMPZA ER 9 MG C12A Take 1 capsule by mouth every 12 (twelve) hours.     No current facility-administered medications for this visit.    Allergies as of 04/06/2020 - Review Complete 04/06/2020  Allergen Reaction Noted  . Ambien [zolpidem] Other (See Comments) 12/03/2019  . Carafate [sucralfate] Hives and Itching 12/03/2019  . Lexapro [escitalopram oxalate]  03/10/2020    Family History  Problem Relation Age of Onset  . Breast cancer Mother   . Diabetes Father   . Heart disease Father     Social History   Socioeconomic History  . Marital status: Married    Spouse name: Not on file  . Number of children: Not on file  . Years of education: Not on file  . Highest education level: Not on file  Occupational History  . Not on file  Tobacco Use  . Smoking status: Former Smoker    Types: Cigarettes    Quit date: 08/22/2019    Years since quitting: 0.6  . Smokeless tobacco: Never Used  Vaping Use  . Vaping Use: Some days  . Substances: Flavoring  . Devices: once every 2 weeks  Substance and Sexual Activity  . Alcohol use: Not Currently  . Drug use: Not Currently  . Sexual activity: Not on file  Other Topics Concern  . Not on file  Social History Narrative  . Not on file   Social Determinants of Health   Financial Resource Strain:   . Difficulty of Paying Living Expenses:   Food Insecurity:   . Worried About Charity fundraiser in the Last Year:   . Arboriculturist in the Last Year:   Transportation Needs:   . Film/video editor (Medical):   Marland Kitchen Lack of Transportation (Non-Medical):   Physical Activity:   . Days of Exercise per Week:   . Minutes of Exercise per Session:   Stress:   . Feeling of Stress :   Social Connections:   . Frequency of Communication with Friends and Family:   . Frequency of Social Gatherings with Friends and Family:   . Attends Religious Services:   . Active Member of Clubs or Organizations:   . Attends Archivist  Meetings:   Marland Kitchen Marital Status:   Intimate Partner Violence:   . Fear of Current or Ex-Partner:   . Emotionally Abused:   Marland Kitchen Physically Abused:   . Sexually Abused:      Physical Exam: BP 100/66   Pulse 68   Ht _0  (1.753 m)   Wt 209 lb (94.8 kg)   BMI 30.86 kg/m  Constitutional: generally well-appearing Psychiatric: alert and oriented x3 Abdomen: soft, nontender, nondistended, no obvious ascites, no peritoneal signs, normal bowel sounds No peripheral edema noted in lower extremities  Assessment and  plan: 59 y.o. male with alcoholic cirrhosis  Creatinine last week was 1.7.  I am going to have him completely stop his Lasix and he will get a repeat basic metabolic profile in 2 weeks.  He stopped taking lactulose several weeks ago and has noticed no ill effect on his mental status.  He is having a BM every day at least once or twice.  I am removing the lactulose from his list, I do not think he needs it anymore.  I am also removing Xifaxan because it is only used as an adjunct to lactulose.  He is to call if he has signs of encephalopathy.  He will return to see me in the office in 3 months.  I told him he should not resume alcohol drinking of any kind because he may not be able to stop it just 1 drink.  Please see the "Patient Instructions" section for addition details about the plan.  Owens Loffler, MD [facility] 04/06/2020, 2:19 PM   Total time on date of encounter was 30 minutes (this included time spent preparing to see the patient reviewing records; obtaining and/or reviewing separately obtained history; performing a medically appropriate exam and/or evaluation; counseling and educating the patient and family if present; ordering medications, tests or procedures if applicable; and documenting clinical information in the health record).

## 2020-04-06 NOTE — Patient Instructions (Addendum)
If you are age 59 or older, your body mass index should be between 23-30. Your Body mass index is 30.86 kg/m. If this is out of the aforementioned range listed, please consider follow up with your Primary Care Provider.  If you are age 59 or younger, your body mass index should be between 19-25. Your Body mass index is 30.86 kg/m. If this is out of the aformentioned range listed, please consider follow up with your Primary Care Provider.   You will have lab work (BMET) in 2 weeks (August 31) at lab of your choice.  Please have lab to fax results to [phone] for our review.    You will be seen in 3 months (November 2021) for follow up.    STOP: Xifaxin  STOP: Lasix  Thank you for entrusting me with your care and choosing [facility].  Dr Christella Hartigan

## 2020-04-07 ENCOUNTER — Ambulatory Visit: Payer: Medicare Other | Admitting: "Endocrinology

## 2020-04-13 ENCOUNTER — Ambulatory Visit: Payer: Medicare Other | Admitting: Nutrition

## 2020-04-21 ENCOUNTER — Ambulatory Visit: Payer: Medicare Other | Admitting: Nutrition

## 2020-04-21 ENCOUNTER — Ambulatory Visit: Payer: Medicare Other | Admitting: "Endocrinology

## 2020-05-31 ENCOUNTER — Encounter: Payer: Self-pay | Admitting: *Deleted

## 2020-06-01 ENCOUNTER — Ambulatory Visit: Payer: Medicare Other | Admitting: Diagnostic Neuroimaging

## 2020-08-02 ENCOUNTER — Ambulatory Visit (INDEPENDENT_AMBULATORY_CARE_PROVIDER_SITE_OTHER): Payer: Medicare Other | Admitting: Gastroenterology

## 2020-08-02 ENCOUNTER — Encounter: Payer: Self-pay | Admitting: Gastroenterology

## 2020-08-02 ENCOUNTER — Other Ambulatory Visit (INDEPENDENT_AMBULATORY_CARE_PROVIDER_SITE_OTHER): Payer: Medicare Other

## 2020-08-02 VITALS — BP 116/72 | HR 68 | Ht 69.5 in | Wt 212.1 lb

## 2020-08-02 DIAGNOSIS — K7031 Alcoholic cirrhosis of liver with ascites: Secondary | ICD-10-CM

## 2020-08-02 LAB — COMPREHENSIVE METABOLIC PANEL
ALT: 21 U/L (ref 0–53)
AST: 22 U/L (ref 0–37)
Albumin: 4.5 g/dL (ref 3.5–5.2)
Alkaline Phosphatase: 89 U/L (ref 39–117)
BUN: 16 mg/dL (ref 6–23)
CO2: 22 mEq/L (ref 19–32)
Calcium: 10 mg/dL (ref 8.4–10.5)
Chloride: 98 mEq/L (ref 96–112)
Creatinine, Ser: 1.44 mg/dL (ref 0.40–1.50)
GFR: 53.1 mL/min — ABNORMAL LOW (ref >60.00)
Glucose, Bld: 547 mg/dL (ref 70–99)
Potassium: 4.3 mEq/L (ref 3.5–5.1)
Sodium: 132 mEq/L — ABNORMAL LOW (ref 135–145)
Total Bilirubin: 0.5 mg/dL (ref 0.2–1.2)
Total Protein: 7.8 g/dL (ref 6.0–8.3)

## 2020-08-02 LAB — CBC
HCT: 44.4 % (ref 39.0–52.0)
Hemoglobin: 14.6 g/dL (ref 13.0–17.0)
MCHC: 33 g/dL (ref 30.0–36.0)
MCV: 81.1 fl (ref 78.0–100.0)
Platelets: 104 10*3/uL — ABNORMAL LOW (ref 150.0–400.0)
RBC: 5.48 Mil/uL (ref 4.22–5.81)
RDW: 16 % — ABNORMAL HIGH (ref 11.5–15.5)
WBC: 7.4 10*3/uL (ref 4.0–10.5)

## 2020-08-02 LAB — PROTIME-INR
INR: 1.2 ratio — ABNORMAL HIGH (ref 0.8–1.0)
Prothrombin Time: 13.3 s — ABNORMAL HIGH (ref 9.6–13.1)

## 2020-08-02 NOTE — Patient Instructions (Addendum)
If you are age 59 or younger, your body mass index should be between 19-25. Your Body mass index is 30.88 kg/m. If this is out of the aformentioned range listed, please consider follow up with your Primary Care Provider.   Your provider has requested that you go to the basement level for lab work before leaving today. Press "B" on the elevator. The lab is located at the first door on the left as you exit the elevator.  Due to recent changes in healthcare laws, you may see the results of your imaging and laboratory studies on MyChart before your provider has had a chance to review them.  We understand that in some cases there may be results that are confusing or concerning to you. Not all laboratory results come back in the same time frame and the provider may be waiting for multiple results in order to interpret others.  Please give [place] 48 hours in order for your provider to thoroughly review all the results before contacting the office for clarification of your results.   You will have an ultrasound in Aril 2022.  We will call you to schedule.  You will be seen in follow up in June 2022.  We will call you to schedule.  Thank you for entrusting me with your care and choosing [facility].  Dr Christella Hartigan

## 2020-08-02 NOTE — Progress Notes (Signed)
Review of pertinent gastrointestinal problems: 1.Routine risk for colon cancer. Colonoscopy April 2034fund no polyps or cancers. He did have prominent rectal vessels which may be portal hypertensive related. 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 Prolonged hospital stay including [facility]. Temporary Gtube feeding. [facility]  MELD-Na 13(03/2020 labs)  Liver imaging:MRI April 2021 showed cirrhosis, portal hypertension, no focal tumors in liver. Minimal ascites.  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 Nonspecific gastritis which was H. pylori negative on biopsy. Propranolol 1050mid  No alcohol intake since January 2021   HPI: This is a very pleasant [age]ar old man   I last saw him about 4 months ago here in the office.  Is doing very well overall.  He had still not resumed drinking.  He had stopped his lactulose, we stopped his Xifaxan.  We completely stopped his Lasix as it did not appear he needed it anymore.  He weighed 209 pounds at the time of his last visit and he had no edema in his lower extremities  Since that last office visit he has continued to do very well.  He continues to completely abstain from alcohol and does not find that to difficulty fortunately.  He has had no overt GI bleeding, no encephalopathy, no trouble with edema.  ROS: complete GI ROS as described in HPI, all other review negative.  Constitutional:  No unintentional weight loss   Past Medical History:  Diagnosis Date  . Anxiety   . Ascites   . Chronic pain syndrome   . CKD (chronic kidney disease), stage III ([place] . COPD (chronic obstructive pulmonary disease)  ([place] . Diabetes mellitus without complication ([place] . Essential hypertension   . GERD (gastroesophageal reflux disease)   . Heart attack ([place] . Hyperlipidemia   . Insomnia   . Sleep apnea     Past Surgical History:  Procedure Laterality Date  . APPENDECTOMY    . BACK SURGERY    . BIOPSY  12/18/2019   Procedure: BIOPSY;  Surgeon: JacMilus BanisterD;  [facility];  Service: Endoscopy;;  . CARDIAC CATHETERIZATION    . CHOLECYSTECTOMY    . COLONOSCOPY WITH PROPOFOL N/A 12/18/2019   Procedure: COLONOSCOPY WITH PROPOFOL;  Surgeon: JacMilus BanisterD;  [facility];  Service: Endoscopy;  Laterality: N/A;  . ESOPHAGOGASTRODUODENOSCOPY (EGD) WITH PROPOFOL N/A 12/18/2019   Procedure: ESOPHAGOGASTRODUODENOSCOPY (EGD) WITH PROPOFOL;  Surgeon: JacMilus BanisterD;  [facility];  Service: Endoscopy;  Laterality: N/A;    Current Outpatient Medications  Medication Sig Dispense Refill  . BRILINTA 90 MG TABS tablet Take 90 mg by mouth 2 (two) times daily.    . bMarland KitchenPROPion (WELLBUTRIN XL) 300 MG 24 hr tablet Take 300 mg by mouth daily.    . citalopram (CELEXA) 10 MG tablet Take 10 mg by mouth at bedtime.     . clonazePAM (KLONOPIN) 0.5 MG tablet Take 0.5 mg by mouth at bedtime.     . folic acid (FOLVITE) 1 MG tablet Take 1 mg by mouth daily.    . insulin glargine, 2 Unit Dial, (TOUJEO MAX SOLOSTAR) 300 UNIT/ML Solostar Pen Inject 60 Units into the skin at bedtime. 2 pen 2  .  insulin lispro (HUMALOG) 100 UNIT/ML injection Inject into the skin 3 (three) times daily before meals.    . naloxone (NARCAN) 4 MG/0.1ML LIQD nasal spray kit Place 1 spray into the nose as needed (accidental overdose.).     Marland Kitchen pantoprazole (PROTONIX) 40 MG tablet Take 40 mg by mouth daily.    . pregabalin (LYRICA) 200 MG capsule Take 200 mg by mouth in the morning, at noon, and at bedtime.     . propranolol (INDERAL) 10 MG tablet Take 1 tablet (10 mg total) by mouth 3 (three) times daily.  90 tablet 11  . thiamine (VITAMIN B-1) 100 MG tablet Take 100 mg by mouth daily.    Marland Kitchen XTAMPZA ER 9 MG C12A Take 1 capsule by mouth every 12 (twelve) hours.     No current facility-administered medications for this visit.    Allergies as of 08/02/2020 - Review Complete 08/02/2020  Allergen Reaction Noted  . Ambien [zolpidem] Other (See Comments) 12/03/2019  . Carafate [sucralfate] Hives and Itching 12/03/2019  . Lexapro [escitalopram oxalate] Nausea Only 03/10/2020    Family History  Problem Relation Age of Onset  . Breast cancer Mother   . Diabetes Father   . Heart disease Father   . Lung disease Father   . Heart disease Sister   . Hypertension Sister   . Lung disease Sister   . Heart disease Brother     Social History   Socioeconomic History  . Marital status: Married    Spouse name: Not on file  . Number of children: Not on file  . Years of education: Not on file  . Highest education level: Not on file  Occupational History  . Not on file  Tobacco Use  . Smoking status: Former Smoker    Types: Cigarettes    Quit date: 08/22/2019    Years since quitting: 0.9  . Smokeless tobacco: Never Used  Vaping Use  . Vaping Use: Some days  . Substances: Flavoring  . Devices: once every 2 weeks  Substance and Sexual Activity  . Alcohol use: Not Currently  . Drug use: Not Currently  . Sexual activity: Not on file  Other Topics Concern  . Not on file  Social History Narrative  . Not on file   Social Determinants of Health   Financial Resource Strain: Not on file  Food Insecurity: Not on file  Transportation Needs: Not on file  Physical Activity: Not on file  Stress: Not on file  Social Connections: Not on file  Intimate Partner Violence: Not on file     Physical Exam: BP 116/72 (BP Location: Left Arm, Patient Position: Sitting, Cuff Size: Normal)   Pulse 68   Ht 5' 9.5" (1.765 m) Comment: height measured without shoes  Wt 212 lb 2 oz (96.2 kg)   BMI 30.88 kg/m   Constitutional: generally well-appearing Psychiatric: alert and oriented x3 Abdomen: soft, nontender, nondistended, no obvious ascites, no peritoneal signs, normal bowel sounds No peripheral edema noted in lower extremities  Assessment and plan: 59 y.o. male with alcohol-related cirrhosis  Clinically he is doing very well since he stopped drinking alcohol almost 1 year ago.  He has no edema, no encephalopathy.  He does not require lactulose, Xifaxan or diuretics any longer.  He will get a repeat set of labs today to check current meld score.  He will need liver imaging with an ultrasound April 2022 and a follow-up appointment with me June 2022, 6 months from now.  He knows to call here if he has any further questions or concerns or problems before those appointments.  Please see the "Patient Instructions" section for addition details about the plan.  Owens Loffler, MD [facility] 08/02/2020, 8:39 AM   Total time on date of encounter was 30 minutes (this included time spent preparing to see the patient reviewing records; obtaining and/or reviewing separately obtained history; performing a medically appropriate exam and/or evaluation; counseling and educating the patient and family if present; ordering medications, tests or procedures if applicable; and documenting clinical information in the health record).

## 2020-08-24 ENCOUNTER — Encounter: Payer: Self-pay | Admitting: Endocrinology

## 2020-10-21 ENCOUNTER — Other Ambulatory Visit: Payer: Self-pay

## 2020-10-25 ENCOUNTER — Ambulatory Visit: Payer: Medicare Other | Admitting: Endocrinology

## 2020-11-12 ENCOUNTER — Telehealth: Payer: Self-pay

## 2020-11-12 DIAGNOSIS — K7031 Alcoholic cirrhosis of liver with ascites: Secondary | ICD-10-CM

## 2020-11-12 NOTE — Telephone Encounter (Signed)
-----   Message from Lamona Curl, [place] sent at 08/02/2020  8:46 AM EST ----- Regarding: abd u/s attn live Patient will need abd ultrasound attn liver dx alcoholic cirrohosis. Per Samson Frederic

## 2020-11-12 NOTE — Telephone Encounter (Signed)
Patient aware that he has been scheduled for an abdominal ultrasound at [facility] (1st floor of hospital) on 11-17-2020 at 11am.  Advised to arrive 15 minutes prior to  appointment for registration. Patient advised not to have anything to eat or drink after midnight the night prior to appointment. Patient advised that should he need to reschedule  appointment, he should contact radiology at [phone]. This test typically takes about 30 minutes to perform.

## 2020-11-17 ENCOUNTER — Ambulatory Visit (HOSPITAL_COMMUNITY): Payer: Medicare Other

## 2020-12-01 ENCOUNTER — Ambulatory Visit (HOSPITAL_COMMUNITY)
Admission: RE | Admit: 2020-12-01 | Discharge: 2020-12-01 | Disposition: A | Discharge status: Home / Self Care | Payer: Medicare Other | Source: Ambulatory Visit | Attending: Gastroenterology | Admitting: Gastroenterology

## 2020-12-01 ENCOUNTER — Other Ambulatory Visit: Payer: Self-pay

## 2020-12-01 DIAGNOSIS — K7031 Alcoholic cirrhosis of liver with ascites: Secondary | ICD-10-CM | POA: Insufficient documentation

## 2020-12-06 ENCOUNTER — Other Ambulatory Visit: Payer: Self-pay

## 2020-12-06 DIAGNOSIS — K7031 Alcoholic cirrhosis of liver with ascites: Secondary | ICD-10-CM

## 2021-01-02 NOTE — Addendum Note (Signed)
Encounter addended by: Novella Olive on: 01/02/2021 6:35 PM  Actions taken: Letter saved

## 2021-01-11 ENCOUNTER — Other Ambulatory Visit: Payer: Self-pay

## 2021-01-12 ENCOUNTER — Telehealth: Payer: Self-pay | Admitting: Endocrinology

## 2021-01-12 ENCOUNTER — Other Ambulatory Visit: Payer: Self-pay

## 2021-01-12 ENCOUNTER — Ambulatory Visit (INDEPENDENT_AMBULATORY_CARE_PROVIDER_SITE_OTHER): Payer: Medicare Other | Admitting: Endocrinology

## 2021-01-12 VITALS — BP 114/60 | HR 74 | Ht 69.5 in | Wt 215.6 lb

## 2021-01-12 DIAGNOSIS — E119 Type 2 diabetes mellitus without complications: Secondary | ICD-10-CM

## 2021-01-12 DIAGNOSIS — Z794 Long term (current) use of insulin: Secondary | ICD-10-CM | POA: Diagnosis not present

## 2021-01-12 DIAGNOSIS — E1122 Type 2 diabetes mellitus with diabetic chronic kidney disease: Secondary | ICD-10-CM

## 2021-01-12 DIAGNOSIS — N183 Chronic kidney disease, stage 3 unspecified: Secondary | ICD-10-CM | POA: Diagnosis not present

## 2021-01-12 LAB — POCT GLYCOSYLATED HEMOGLOBIN (HGB A1C): Hemoglobin A1C: 14.6 % — AB (ref 4.0–5.6)

## 2021-01-12 MED ORDER — TOUJEO MAX SOLOSTAR 300 UNIT/ML ~~LOC~~ SOPN: 80.0000 [IU] | PEN_INJECTOR | SUBCUTANEOUS | 3 refills | Status: AC

## 2021-01-12 MED ORDER — INSULIN LISPRO (1 UNIT DIAL) 100 UNIT/ML (KWIKPEN): 50.0000 [IU] | PEN_INJECTOR | Freq: Every day | SUBCUTANEOUS | 3 refills | Status: DC

## 2021-01-12 NOTE — Telephone Encounter (Signed)
please contact patient: The chart says the humalog was 15 units 3 times a day (just before each meal), but you were saying 50 units with breakfast.  It is really important that we be sure which it is.

## 2021-01-12 NOTE — Telephone Encounter (Signed)
Pt's vm was full so message was sent thru MyChart. Waiting on response.

## 2021-01-12 NOTE — Patient Instructions (Signed)
good diet and exercise significantly improve the control of your diabetes.  please let me know if you wish to be referred to a dietician.  high blood sugar is very risky to your health.  you should see an eye doctor and dentist every year.  It is very important to get all recommended vaccinations.  Controlling your blood pressure and cholesterol drastically reduces the damage diabetes does to your body.  Those who smoke should quit.  Please discuss these with your doctor.  check your blood sugar twice a day.  vary the time of day when you check, between before the 3 meals, and at bedtime.  also check if you have symptoms of your blood sugar being too high or too low.  please keep a record of the readings and bring it to your next appointment here (or you can bring the meter itself).  You can write it on any piece of paper.  please call us sooner if your blood sugar goes below 70, or if most of your readings are over 200. We will need to take this complex situation in stages. For now, please increase the Toujeo to 80 units each morning, and continue the same Humalog. Please call or message [place] next week, to tell us how the blood sugar is doing.  Please come back for a follow-up appointment in 2 months.

## 2021-01-12 NOTE — Progress Notes (Signed)
Subjective:    Patient ID: Spencer Fletcher, male    DOB: 1961/08/05, 60 y.o.   MRN: [id number]  HPI pt is referred by Dr Sallee Provencal, for diabetes.  Pt states DM was dx'ed in 6811; it is complicated by PN, CAD, and stage 3 CRI; he has been on insulin since dx; pt says his diet and exercise are good; he has never had pancreatitis, pancreatic surgery, or DKA.  Last episode of severe hypoglycemia was 2021.  He takes Toujeo 60 QAM, and Humalog 50 units qd with breakfast.  He stopped Trulicity due to liver dz. He says cbg's are almost always 400-500.   Past Medical History:  Diagnosis Date  . Anxiety   . Ascites   . Chronic pain syndrome   . CKD (chronic kidney disease), stage III ([place])   . COPD (chronic obstructive pulmonary disease) ([place])   . Diabetes mellitus without complication ([place])   . Essential hypertension   . GERD (gastroesophageal reflux disease)   . Heart attack ([place])   . Hyperlipidemia   . Insomnia   . Sleep apnea     Past Surgical History:  Procedure Laterality Date  . APPENDECTOMY    . BACK SURGERY    . BIOPSY  12/18/2019   Procedure: BIOPSY;  Surgeon: Milus Banister, MD;  Location: [facility];  Service: Endoscopy;;  . CARDIAC CATHETERIZATION    . CHOLECYSTECTOMY    . COLONOSCOPY WITH PROPOFOL N/A 12/18/2019   Procedure: COLONOSCOPY WITH PROPOFOL;  Surgeon: Milus Banister, MD;  Location: [facility];  Service: Endoscopy;  Laterality: N/A;  . ESOPHAGOGASTRODUODENOSCOPY (EGD) WITH PROPOFOL N/A 12/18/2019   Procedure: ESOPHAGOGASTRODUODENOSCOPY (EGD) WITH PROPOFOL;  Surgeon: Milus Banister, MD;  Location: [facility];  Service: Endoscopy;  Laterality: N/A;    Social History   Socioeconomic History  . Marital status: Married    Spouse name: Not on file  . Number of children: Not on file  . Years of education: Not on file  . Highest education level: Not on file  Occupational History  . Not on file  Tobacco Use  . Smoking status: Former Smoker    Types:  Cigarettes    Quit date: 08/22/2019    Years since quitting: 1.3  . Smokeless tobacco: Never Used  Vaping Use  . Vaping Use: Some days  . Substances: Flavoring  . Devices: once every 2 weeks  Substance and Sexual Activity  . Alcohol use: Not Currently  . Drug use: Not Currently  . Sexual activity: Not on file  Other Topics Concern  . Not on file  Social History Narrative  . Not on file   Social Determinants of Health   Financial Resource Strain: Not on file  Food Insecurity: Not on file  Transportation Needs: Not on file  Physical Activity: Not on file  Stress: Not on file  Social Connections: Not on file  Intimate Partner Violence: Not on file    Current Outpatient Medications on File Prior to Visit  Medication Sig Dispense Refill  . BRILINTA 90 MG TABS tablet Take 90 mg by mouth 2 (two) times daily.    Marland Kitchen buPROPion (WELLBUTRIN XL) 300 MG 24 hr tablet Take 300 mg by mouth daily.    . citalopram (CELEXA) 10 MG tablet Take 10 mg by mouth at bedtime.     . clonazePAM (KLONOPIN) 0.5 MG tablet Take 0.5 mg by mouth at bedtime.     . folic acid (FOLVITE) 1 MG tablet Take 1  mg by mouth daily.    . naloxone (NARCAN) 4 MG/0.1ML LIQD nasal spray kit Place 1 spray into the nose as needed (accidental overdose.).     Marland Kitchen pantoprazole (PROTONIX) 40 MG tablet Take 40 mg by mouth daily.    . pregabalin (LYRICA) 200 MG capsule Take 200 mg by mouth in the morning, at noon, and at bedtime.     . propranolol (INDERAL) 10 MG tablet Take 1 tablet (10 mg total) by mouth 3 (three) times daily. 90 tablet 11  . thiamine (VITAMIN B-1) 100 MG tablet Take 100 mg by mouth daily.    Marland Kitchen XTAMPZA ER 9 MG C12A Take 1 capsule by mouth every 12 (twelve) hours.     No current facility-administered medications on file prior to visit.    Allergies  Allergen Reactions  . Ambien [Zolpidem] Other (See Comments)  . Carafate [Sucralfate] Hives and Itching  . Lexapro [Escitalopram Oxalate] Nausea Only    Family  History  Problem Relation Age of Onset  . Breast cancer Mother   . Diabetes Father   . Heart disease Father   . Lung disease Father   . Heart disease Sister   . Hypertension Sister   . Lung disease Sister   . Heart disease Brother     BP 114/60 (BP Location: Right Arm, Patient Position: Sitting, Cuff Size: Large)   Pulse 74   Ht 5' 9.5" (1.765 m)   Wt 215 lb 9.6 oz (97.8 kg)   SpO2 97%   BMI 31.38 kg/m     Review of Systems denies sob and hypoglycemia.  He reports memory loss.  He has regained weight.       Objective:   Physical Exam VITAL SIGNS:  See vs page GENERAL: no distress Pulses: dorsalis pedis intact bilat.   MSK: no deformity of the feet CV: no leg edema Skin:  no ulcer on the feet.  normal color and temp on the feet. Neuro: sensation is intact to touch on the feet.   Lab Results  Component Value Date   HGBA1C 14.6 (A) 01/12/2021   I have reviewed outside records, and summarized: Pt was noted to have elevated A1c, and referred here.  Dyslipidemia chronic pain, cirrhosis, depression, and HTN were also addressed.       Assessment & Plan:  Insulin-requiring type 2 DM: uncontrolled Medication uncertainty.  I have asked pt to reconcile humalog 15 TID vs 50/d.    Patient Instructions  good diet and exercise significantly improve the control of your diabetes.  please let me know if you wish to be referred to a dietician.  high blood sugar is very risky to your health.  you should see an eye doctor and dentist every year.  It is very important to get all recommended vaccinations.  Controlling your blood pressure and cholesterol drastically reduces the damage diabetes does to your body.  Those who smoke should quit.  Please discuss these with your doctor.  check your blood sugar twice a day.  vary the time of day when you check, between before the 3 meals, and at bedtime.  also check if you have symptoms of your blood sugar being too high or too low.  please keep  a record of the readings and bring it to your next appointment here (or you can bring the meter itself).  You can write it on any piece of paper.  please call us sooner if your blood sugar goes below 70, or if  most of your readings are over 200. We will need to take this complex situation in stages. For now, please increase the Toujeo to 80 units each morning, and continue the same Humalog. Please call or message [place] next week, to tell us how the blood sugar is doing.  Please come back for a follow-up appointment in 2 months.

## 2021-02-07 ENCOUNTER — Ambulatory Visit: Payer: Medicare Other | Admitting: Gastroenterology

## 2021-02-09 ENCOUNTER — Ambulatory Visit: Payer: Medicare Other | Admitting: Gastroenterology

## 2021-04-11 ENCOUNTER — Ambulatory Visit: Payer: Medicare Other | Admitting: Endocrinology

## 2021-04-21 ENCOUNTER — Telehealth: Payer: Self-pay | Admitting: Gastroenterology

## 2021-04-21 MED ORDER — PROPRANOLOL HCL 10 MG PO TABS: 10.0000 mg | ORAL_TABLET | Freq: Three times a day (TID) | ORAL | 1 refills | Status: DC

## 2021-04-21 NOTE — Telephone Encounter (Signed)
Rx for propranolol sent to pharmacy as requested.

## 2021-05-10 ENCOUNTER — Encounter: Payer: Self-pay | Admitting: Gastroenterology

## 2021-05-10 ENCOUNTER — Other Ambulatory Visit (INDEPENDENT_AMBULATORY_CARE_PROVIDER_SITE_OTHER): Payer: Medicare Other

## 2021-05-10 ENCOUNTER — Ambulatory Visit (INDEPENDENT_AMBULATORY_CARE_PROVIDER_SITE_OTHER): Payer: Medicare Other | Admitting: Gastroenterology

## 2021-05-10 VITALS — BP 110/70 | HR 60 | Ht 69.5 in | Wt 217.0 lb

## 2021-05-10 DIAGNOSIS — K7031 Alcoholic cirrhosis of liver with ascites: Secondary | ICD-10-CM

## 2021-05-10 LAB — COMPREHENSIVE METABOLIC PANEL
ALT: 21 U/L (ref 0–53)
AST: 19 U/L (ref 0–37)
Albumin: 4.4 g/dL (ref 3.5–5.2)
Alkaline Phosphatase: 76 U/L (ref 39–117)
BUN: 19 mg/dL (ref 6–23)
CO2: 27 mEq/L (ref 19–32)
Calcium: 9.6 mg/dL (ref 8.4–10.5)
Chloride: 97 mEq/L (ref 96–112)
Creatinine, Ser: 1.49 mg/dL (ref 0.40–1.50)
GFR: 50.69 mL/min — ABNORMAL LOW (ref >60.00)
Glucose, Bld: 585 mg/dL (ref 70–99)
Potassium: 5 mEq/L (ref 3.5–5.1)
Sodium: 132 mEq/L — ABNORMAL LOW (ref 135–145)
Total Bilirubin: 0.7 mg/dL (ref 0.2–1.2)
Total Protein: 8 g/dL (ref 6.0–8.3)

## 2021-05-10 LAB — CBC WITH DIFFERENTIAL/PLATELET
Basophils Absolute: 0 10*3/uL (ref 0.0–0.1)
Basophils Relative: 0.5 % (ref 0.0–3.0)
Eosinophils Absolute: 0.4 10*3/uL (ref 0.0–0.7)
Eosinophils Relative: 6.6 % — ABNORMAL HIGH (ref 0.0–5.0)
HCT: 42.6 % (ref 39.0–52.0)
Hemoglobin: 13.6 g/dL (ref 13.0–17.0)
Lymphocytes Relative: 22 % (ref 12.0–46.0)
Lymphs Abs: 1.2 10*3/uL (ref 0.7–4.0)
MCHC: 31.9 g/dL (ref 30.0–36.0)
MCV: 79.4 fl (ref 78.0–100.0)
Monocytes Absolute: 0.3 10*3/uL (ref 0.1–1.0)
Monocytes Relative: 4.8 % (ref 3.0–12.0)
Neutro Abs: 3.6 10*3/uL (ref 1.4–7.7)
Neutrophils Relative %: 66.1 % (ref 43.0–77.0)
Platelets: 87 10*3/uL — ABNORMAL LOW (ref 150.0–400.0)
RBC: 5.36 Mil/uL (ref 4.22–5.81)
RDW: 19.3 % — ABNORMAL HIGH (ref 11.5–15.5)
WBC: 5.5 10*3/uL (ref 4.0–10.5)

## 2021-05-10 LAB — PROTIME-INR
INR: 1.1 ratio — ABNORMAL HIGH (ref 0.8–1.0)
Prothrombin Time: 12.3 s (ref 9.6–13.1)

## 2021-05-10 NOTE — Patient Instructions (Signed)
If you are age 60 or younger, your body mass index should be between 19-25. Your Body mass index is 31.59 kg/m. If this is out of the aformentioned range listed, please consider follow up with your Primary Care Provider.  _________________________________________________________  The [facility] providers would like to encourage you to use [facility] to communicate with providers for non-urgent requests or questions.  Due to long hold times on the telephone, sending your provider a message by [facility] may be a faster and more efficient way to get a response.  Please allow 48 business hours for a response.  Please remember that this is for non-urgent requests.   Your provider has requested that you go to the basement level for lab work before leaving today. Press "B" on the elevator. The lab is located at the first door on the left as you exit the elevator.  You have been scheduled for an abdominal ultrasound at [facility]iology (1st floor of hospital) on 05-17-21 at 9am. Please arrive 15 minutes prior to your appointment for registration. Make certain not to have anything to eat or drink after midnight prior to your appointment. Should you need to reschedule your appointment, please contact radiology at [phone]. This test typically takes about 30 minutes to perform.  Due to recent changes in healthcare laws, you may see the results of your imaging and laboratory studies on MyChart before your provider has had a chance to review them.  We understand that in some cases there may be results that are confusing or concerning to you. Not all laboratory results come back in the same time frame and the provider may be waiting for multiple results in order to interpret others.  Please give [place] 48 hours in order for your provider to thoroughly review all the results before contacting the office for clarification of your results.   You will need a follow up appointment in 6 months (February 2023).  We will  contact you to schedule this appointment.  Thank you for entrusting me with your care and choosing [facility].  Dr Christella Hartigan

## 2021-05-10 NOTE — Progress Notes (Signed)
Review of pertinent gastrointestinal problems: 1.  Routine risk for colon cancer.  Colonoscopy April 2021 found no polyps or cancers.  He did have prominent rectal vessels which may be portal hypertensive related. 2.  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  Prolonged hospital stay including several weeks in [facility] facility while intubated.  Temporary G tube feeding.  Presented to [facility] to establish care for 5 days after [facility] discharge. MELD-Na 11 (03/2021 labs) Liver imaging: MRI April 2021 showed cirrhosis, portal hypertension, no focal tumors in liver.  Minimal ascites.  [place] April 2022 cirrhosis without focal liver lesion. 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  Nonspecific gastritis which was H. pylori negative on biopsy.  Propranolol 38m tid started No alcohol intake since January 2021  HPI: This is a very pleasant 60year old man whom I last saw about 10 months ago, December 2021.  Blood work, outside data through care everywhere August 27672complete metabolic profile was normal except for glucose 296, INR 1.2, hemoglobin 13.2, platelets 98:  MELD Na 11  His weight is up 5 pounds since his last office visit here about 10 months ago.  He is here today for routine cirrhosis follow-up  He has felt overall well from a liver perspective.  No significant edema in his legs or abdomen.  No vomiting blood.  He tends to be tired generally throughout the day but that is not new for him.  No significant disorientation or confusion episodes.  He [place] to revascularize both of his legs due to what sounds like claudication  He again confirms that his last alcohol intake was January 2021  ROS: complete GI ROS as  described in HPI, all other review negative.  Constitutional:  No unintentional weight loss   Past Medical History:  Diagnosis Date   Anxiety    Ascites    Chronic pain syndrome    CKD (chronic kidney disease), stage III ([facility])    COPD (chronic obstructive pulmonary disease) ([place]    Diabetes mellitus without complication ([place]    Essential hypertension    GERD (gastroesophageal reflux disease)    Heart attack ([place]    Hyperlipidemia    Insomnia    Sleep apnea     Past Surgical History:  Procedure Laterality Date   APPENDECTOMY     BACK SURGERY     BIOPSY  12/18/2019   Procedure: BIOPSY;  Surgeon: JMilus Banister MD;  Location: [facility];  Service: Endoscopy;;   CARDIAC CATHETERIZATION     CHOLECYSTECTOMY     COLONOSCOPY WITH PROPOFOL N/A 12/18/2019   Procedure: COLONOSCOPY WITH PROPOFOL;  Surgeon: JMilus Banister MD;  Location: [facility];  Service: Endoscopy;  Laterality: N/A;   ESOPHAGOGASTRODUODENOSCOPY (EGD) WITH PROPOFOL N/A 12/18/2019   Procedure: ESOPHAGOGASTRODUODENOSCOPY (EGD) WITH PROPOFOL;  Surgeon: JMilus Banister MD;  Location: [facility];  Service: Endoscopy;  Laterality: N/A;    Current Outpatient Medications  Medication Sig Dispense Refill   BRILINTA 90 MG TABS tablet Take 90 mg by mouth 2 (two) times daily.     buPROPion (WELLBUTRIN XL) 300 MG 24 hr tablet Take 300 mg by mouth daily.     citalopram (CELEXA) 10 MG tablet Take 10 mg by mouth at bedtime.  clonazePAM (KLONOPIN) 0.5 MG tablet Take 0.5 mg by mouth at bedtime.      folic acid (FOLVITE) 1 MG tablet Take 1 mg by mouth daily.     insulin glargine, 2 Unit Dial, (TOUJEO MAX SOLOSTAR) 300 UNIT/ML Solostar Pen Inject 80 Units into the skin every morning. And pen needles 2/day 30 mL 3   insulin lispro (HUMALOG KWIKPEN) 100 UNIT/ML KwikPen Inject 50 Units into the skin daily with breakfast. 60 mL 3   naloxone (NARCAN) 4 MG/0.1ML LIQD nasal spray kit Place 1 spray into the nose as needed  (accidental overdose.).      pantoprazole (PROTONIX) 40 MG tablet Take 40 mg by mouth daily.     pregabalin (LYRICA) 200 MG capsule Take 200 mg by mouth in the morning, at noon, and at bedtime.      propranolol (INDERAL) 10 MG tablet Take 1 tablet (10 mg total) by mouth 3 (three) times daily. 90 tablet 1   thiamine (VITAMIN B-1) 100 MG tablet Take 100 mg by mouth daily.     XTAMPZA ER 9 MG C12A Take 1 capsule by mouth every 12 (twelve) hours.     No current facility-administered medications for this visit.    Allergies as of 05/10/2021 - Review Complete 05/10/2021  Allergen Reaction Noted   Ambien [zolpidem] Other (See Comments) 12/03/2019   Carafate [sucralfate] Hives and Itching 12/03/2019   Lexapro [escitalopram oxalate] Nausea Only 03/10/2020    Family History  Problem Relation Age of Onset   Breast cancer Mother    Diabetes Father    Heart disease Father    Lung disease Father    Heart disease Sister    Hypertension Sister    Lung disease Sister    Heart disease Brother     Social History   Socioeconomic History   Marital status: Married    Spouse name: Not on file   Number of children: Not on file   Years of education: Not on file   Highest education level: Not on file  Occupational History   Not on file  Tobacco Use   Smoking status: Former    Types: Cigarettes    Quit date: 08/22/2019    Years since quitting: 1.7   Smokeless tobacco: Never  Vaping Use   Vaping Use: Some days   Substances: Flavoring   Devices: once every 2 weeks  Substance and Sexual Activity   Alcohol use: Not Currently   Drug use: Not Currently   Sexual activity: Not on file  Other Topics Concern   Not on file  Social History Narrative   Not on file   Social Determinants of Health   Financial Resource Strain: Not on file  Food Insecurity: Not on file  Transportation Needs: Not on file  Physical Activity: Not on file  Stress: Not on file  Social Connections: Not on file   Intimate Partner Violence: Not on file     Physical Exam: BP 110/70   Pulse 60   Ht 5' 9.5" (1.765 m)   Wt 217 lb (98.4 kg)   BMI 31.59 kg/m  Constitutional: generally well-appearing Psychiatric: alert and oriented x3 Abdomen: soft, nontender, nondistended, no obvious ascites, no peritoneal signs, normal bowel sounds No peripheral edema noted in lower extremities  Assessment and plan: 60 y.o. male with cirrhosis, previous alcoholism  He needs up-to-date hepatoma screening with ultrasound and alpha-fetoprotein next month.  Meld score based on labs elsewhere last month is 11.  He has no  lower extremity edema, no obvious ascites on examination, no clear encephalopathy.  He understands that any surgery and certainly this includes major vascular surgeries can cause decompensation of his cirrhosis.  He understands to be on the look out for that after his upcoming surgery.  He will return to see me in 6 months and sooner if any problems.  Please see the "Patient Instructions" section for addition details about the plan.  Owens Loffler, MD [facility] 05/10/2021, 9:28 AM   Total time on date of encounter was 30 minutes (this included time spent preparing to see the patient reviewing records; obtaining and/or reviewing separately obtained history; performing a medically appropriate exam and/or evaluation; counseling and educating the patient and family if present; ordering medications, tests or procedures if applicable; and documenting clinical information in the health record).

## 2021-05-11 LAB — AFP TUMOR MARKER: AFP-Tumor Marker: 2.5 ng/mL (ref <6.1)

## 2021-05-13 ENCOUNTER — Other Ambulatory Visit: Payer: Self-pay | Admitting: Gastroenterology

## 2021-05-17 ENCOUNTER — Ambulatory Visit (HOSPITAL_COMMUNITY): Payer: Medicare Other

## 2021-06-01 ENCOUNTER — Ambulatory Visit (HOSPITAL_COMMUNITY): Payer: Medicare Other

## 2021-06-10 ENCOUNTER — Ambulatory Visit (HOSPITAL_COMMUNITY): Payer: Medicare Other

## 2021-08-21 HISTORY — PX: ABDOMINAL AORTIC ENDOVASCULAR STENT GRAFT: SHX5707

## 2021-08-25 ENCOUNTER — Telehealth: Payer: Self-pay | Admitting: Gastroenterology

## 2021-08-25 NOTE — Telephone Encounter (Signed)
Inbound call from Wonda Horner NP at [facility], wanting to discuss with Dr. Christella Hartigan about patients hemoglobin. Seeking advice if Dr. Christella Hartigan had any concerns. Please advise.

## 2021-08-25 NOTE — Telephone Encounter (Signed)
Dr Christella Hartigan the pt PCP would like you to call her to discuss the pt.  See message below.

## 2021-08-26 ENCOUNTER — Other Ambulatory Visit: Payer: Self-pay | Admitting: Gastroenterology

## 2021-08-26 NOTE — Telephone Encounter (Signed)
Inbound call from PCP states his hemoglobin is now 9.2

## 2021-08-26 NOTE — Telephone Encounter (Signed)
Spoke with patient. Advise of recommendation to take iron supplement OTC and reminded of appt 2/14

## 2021-10-04 ENCOUNTER — Ambulatory Visit (INDEPENDENT_AMBULATORY_CARE_PROVIDER_SITE_OTHER): Payer: Medicare Other | Admitting: Gastroenterology

## 2021-10-04 ENCOUNTER — Other Ambulatory Visit (INDEPENDENT_AMBULATORY_CARE_PROVIDER_SITE_OTHER): Payer: Medicare Other

## 2021-10-04 ENCOUNTER — Encounter: Payer: Self-pay | Admitting: Gastroenterology

## 2021-10-04 VITALS — BP 110/60 | HR 76 | Ht 69.0 in | Wt 221.4 lb

## 2021-10-04 DIAGNOSIS — K7031 Alcoholic cirrhosis of liver with ascites: Secondary | ICD-10-CM

## 2021-10-04 LAB — CBC
HCT: 41.4 % (ref 39.0–52.0)
Hemoglobin: 13.3 g/dL (ref 13.0–17.0)
MCHC: 32.1 g/dL (ref 30.0–36.0)
MCV: 84.9 fl (ref 78.0–100.0)
Platelets: 86 10*3/uL — ABNORMAL LOW (ref 150.0–400.0)
RBC: 4.87 Mil/uL (ref 4.22–5.81)
RDW: 24.1 % — ABNORMAL HIGH (ref 11.5–15.5)
WBC: 5.5 10*3/uL (ref 4.0–10.5)

## 2021-10-04 LAB — PROTIME-INR
INR: 1.1 ratio — ABNORMAL HIGH (ref 0.8–1.0)
Prothrombin Time: 12.4 s (ref 9.6–13.1)

## 2021-10-04 NOTE — Patient Instructions (Signed)
If you are age 61 or younger, your body mass index should be between 19-25. Your Body mass index is 32.7 kg/m. If this is out of the aformentioned range listed, please consider follow up with your Primary Care Provider.  ________________________________________________________  The [facility] providers would like to encourage you to use [facility] to communicate with providers for non-urgent requests or questions.  Due to long hold times on the telephone, sending your provider a message by [facility] may be a faster and more efficient way to get a response.  Please allow 48 business hours for a response.  Please remember that this is for non-urgent requests.  _______________________________________________________  Your provider has requested that you go to the basement level for lab work before leaving today. Press "B" on the elevator. The lab is located at the first door on the left as you exit the elevator.  You have been scheduled for an abdominal ultrasound at [facility] Radiology (1st floor of hospital) on 10-11-21 at 8:30am. Please arrive 15 minutes prior to your appointment for registration. Make certain not to have anything to eat or drink 6 hours prior to your appointment. Should you need to reschedule your appointment, please contact radiology at [phone]. This test typically takes about 30 minutes to perform.  Due to recent changes in healthcare laws, you may see the results of your imaging and laboratory studies on MyChart before your provider has had a chance to review them.  We understand that in some cases there may be results that are confusing or concerning to you. Not all laboratory results come back in the same time frame and the provider may be waiting for multiple results in order to interpret others.  Please give [place] 48 hours in order for your provider to thoroughly review all the results before contacting the office for clarification of your results.   You will need a follow up  appointment in 6 months. We will contact you to schedule this appointment.  We will work on a Dental referral for you and notify you of an appointment date and time.  Thank you for entrusting me with your care and choosing [facility].  Dr Ardis Hughs

## 2021-10-04 NOTE — Progress Notes (Signed)
Review of pertinent gastrointestinal problems: 1.  Routine risk for colon cancer.  Colonoscopy April 2021 found no polyps or cancers.  He did have prominent rectal vessels which may be portal hypertensive related. 2.  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  Prolonged hospital stay including several weeks in [facility] facility while intubated.  Temporary G tube feeding.  Presented to [facility] to establish care for 5 days after [facility] discharge. MELD-Na 11 (03/2021 labs) Liver imaging: MRI April 2021 showed cirrhosis, portal hypertension, no focal tumors in liver.  Minimal ascites.  [place] April 2022 cirrhosis without focal liver lesion. 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  Nonspecific gastritis which was H. pylori negative on biopsy.  Propranolol 38m tid started No alcohol intake since January 2021 Vaccinations: Immune to hepatitis B as of June 2021.    HPI: This is a very pleasant 61year old man   Last saw him about 5 months ago for routine cirrhosis follow-up.  He his weight at that time was 217 pounds and he did not have edema in his legs or signs of ascites.  His MELD score shortly before that visit was 11.  His weight is up 4 pounds since his last office visit here  Since his last visit he has had 2 stents placed in peripheral lower extremity vessels to help with claudication.  Since then he has been on Plavix.  He says that the stents have helped his claudication partially but not completely.  His last alcohol intake was still about 2 years ago.  He has had intermittent swelling in his ankles and feet.  His cardiologist gave him Lasix 20 mg which she takes on an as-needed basis.  He takes this about once or twice per week.  He  tries to avoid alcohol as best that he can  He has had significant dental pains in the past 2 or 3 months requiring 10-15 ibuprofen on a daily basis.   ROS: complete GI ROS as described in HPI, all other review negative.  Constitutional:  No unintentional weight loss   Past Medical History:  Diagnosis Date   Anxiety    Ascites    Chronic pain syndrome    CKD (chronic kidney disease), stage III (HCC)    COPD (chronic obstructive pulmonary disease) (HCC)    Diabetes mellitus without complication ([facility]    Essential hypertension    GERD (gastroesophageal reflux disease)    Heart attack ([facility]    Hyperlipidemia    Insomnia    Sleep apnea     Past Surgical History:  Procedure Laterality Date   ABDOMINAL AORTIC ENDOVASCULAR STENT GRAFT  2023   APPENDECTOMY     BACK SURGERY     BIOPSY  12/18/2019   Procedure: BIOPSY;  Surgeon: JMilus Banister MD;  Location: [facility];  Service: Endoscopy;;   CARDIAC CATHETERIZATION     CHOLECYSTECTOMY     COLONOSCOPY WITH PROPOFOL N/A 12/18/2019   Procedure: COLONOSCOPY WITH PROPOFOL;  Surgeon: JMilus Banister MD;  Location: [facility];  Service: Endoscopy;  Laterality: N/A;   ESOPHAGOGASTRODUODENOSCOPY (EGD) WITH PROPOFOL N/A 12/18/2019   Procedure: ESOPHAGOGASTRODUODENOSCOPY (EGD) WITH PROPOFOL;  Surgeon: JMilus Banister MD;  Location: [facility];  Service: Endoscopy;  Laterality: N/A;    Current Outpatient Medications  Medication Sig  Dispense Refill   arformoterol (BROVANA) 15 MCG/2ML NEBU 2 (two) times daily.     budesonide (PULMICORT) 0.5 MG/2ML nebulizer solution Take 0.5 mg by nebulization daily.     buPROPion (WELLBUTRIN XL) 150 MG 24 hr tablet Take 150 mg by mouth daily.     citalopram (CELEXA) 10 MG tablet Take 10 mg by mouth at bedtime.      clonazePAM (KLONOPIN) 0.5 MG tablet Take 0.5 mg by mouth at bedtime as needed.     clopidogrel (PLAVIX) 75 MG tablet Take 75 mg by mouth daily.     Cyanocobalamin (VITAMIN B-12 PO)  Take 1 capsule by mouth daily.     FERROUS SULFATE PO Take 1 tablet by mouth daily.     folic acid (FOLVITE) 1 MG tablet Take 1 mg by mouth daily.     furosemide (LASIX) 20 MG tablet Take 20 mg by mouth as needed.     insulin glargine, 2 Unit Dial, (TOUJEO MAX SOLOSTAR) 300 UNIT/ML Solostar Pen Inject 80 Units into the skin every morning. And pen needles 2/day (Patient taking differently: Inject 62 Units into the skin every morning. And pen needles 2/day) 30 mL 3   insulin lispro (HUMALOG KWIKPEN) 100 UNIT/ML KwikPen Inject 50 Units into the skin daily with breakfast. 60 mL 3   naloxone (NARCAN) 4 MG/0.1ML LIQD nasal spray kit Place 1 spray into the nose as needed (accidental overdose.).      pantoprazole (PROTONIX) 40 MG tablet Take 40 mg by mouth 2 (two) times daily.     pregabalin (LYRICA) 200 MG capsule Take 200 mg by mouth in the morning, at noon, and at bedtime.      propranolol (INDERAL) 10 MG tablet TAKE 1 TABLET BY MOUTH THREE TIMES A DAY (Patient taking differently: Take 20 mg every morning, 10 mg every evening) 270 tablet 2   rosuvastatin (CRESTOR) 10 MG tablet Take 10 mg by mouth daily.     Semaglutide (OZEMPIC, 1 MG/DOSE, [place]) Inject 1 Dose into the skin once a week.     thiamine (VITAMIN B-1) 100 MG tablet Take 100 mg by mouth daily.     triamcinolone cream (KENALOG) 0.1 % daily as needed.     XTAMPZA ER 9 MG C12A Take 1 capsule by mouth in the morning, at noon, and at bedtime.     No current facility-administered medications for this visit.    Allergies as of 10/04/2021 - Review Complete 10/04/2021  Allergen Reaction Noted   Ambien [zolpidem] Other (See Comments) 12/03/2019   Carafate [sucralfate] Hives and Itching 12/03/2019   Lexapro [escitalopram oxalate] Nausea Only 03/10/2020    Family History  Problem Relation Age of Onset   Breast cancer Mother    Diabetes Father    Heart disease Father    Lung disease Father    Heart disease Sister    Hypertension Sister     Lung disease Sister    Heart disease Brother     Social History   Socioeconomic History   Marital status: Married    Spouse name: Not on file   Number of children: Not on file   Years of education: Not on file   Highest education level: Not on file  Occupational History   Not on file  Tobacco Use   Smoking status: Every Day    Types: Cigarettes    Last attempt to quit: 08/22/2019    Years since quitting: 2.1   Smokeless tobacco: Never  Vaping Use  Vaping Use: Some days   Substances: Flavoring   Devices: once every 2 weeks  Substance and Sexual Activity   Alcohol use: Not Currently   Drug use: Not Currently   Sexual activity: Not on file  Other Topics Concern   Not on file  Social History Narrative   Not on file   Social Determinants of Health   Financial Resource Strain: Not on file  Food Insecurity: Not on file  Transportation Needs: Not on file  Physical Activity: Not on file  Stress: Not on file  Social Connections: Not on file  Intimate Partner Violence: Not on file     Physical Exam: BP 110/60    Pulse 76    Ht _0  (1.753 m)    Wt 221 lb 6.4 oz (100.4 kg)    BMI 32.70 kg/m  Constitutional: generally well-appearing, however a bit groggy and drowsy eyed Oropharynx: Several cracked, chipped, missing teeth upper and lower Psychiatric: alert and oriented x3 Abdomen: soft, nontender, nondistended, no obvious ascites, no peritoneal signs, normal bowel sounds Trace lower extremity peripheral edema  Assessment and plan: 61 y.o. male with alcoholic cirrhosis, severe dental pains  He is coming up on 2 years since his last significant alcohol intake which I explained to him is probably the best thing he can do for the health of his liver and also his overall health.  He has trace lower extremity edema, I would not want to start any new diuretics based on just this.  I think he can continue to take his Lasix 20 mg on a as needed basis which was prescribed by his  cardiologist.  He needs restaging labs for his liver disease including CBC, complete metabolic profile, coags.  He needs hepatoma screening with an abdominal ultrasound.  We will order those.  I am concerned about his significant dental pains and multiple chipped, cracked teeth in his mouth.  This is causing him to take 15 or so ibuprofen every single day.  I explained to him that he is going to get an ulcer from this sooner or later.  We are going to try to help him find a dentist.  He is also going to try to cut back on the ibuprofen.  Please see the "Patient Instructions" section for addition details about the plan.  Owens Loffler, MD [facility] 10/04/2021, 10:03 AM   Total time on date of encounter was 35 minutes (this included time spent preparing to see the patient reviewing records; obtaining and/or reviewing separately obtained history; performing a medically appropriate exam and/or evaluation; counseling and educating the patient and family if present; ordering medications, tests or procedures if applicable; and documenting clinical information in the health record).

## 2021-10-05 LAB — COMPREHENSIVE METABOLIC PANEL
ALT: 25 U/L (ref 0–53)
AST: 35 U/L (ref 0–37)
Albumin: 4.4 g/dL (ref 3.5–5.2)
Alkaline Phosphatase: 55 U/L (ref 39–117)
BUN: 13 mg/dL (ref 6–23)
CO2: 30 mEq/L (ref 19–32)
Calcium: 9.5 mg/dL (ref 8.4–10.5)
Chloride: 104 mEq/L (ref 96–112)
Creatinine, Ser: 1.21 mg/dL (ref 0.40–1.50)
GFR: 64.89 mL/min (ref >60.00)
Glucose, Bld: 108 mg/dL — ABNORMAL HIGH (ref 70–99)
Potassium: 4.5 mEq/L (ref 3.5–5.1)
Sodium: 140 mEq/L (ref 135–145)
Total Bilirubin: 0.5 mg/dL (ref 0.2–1.2)
Total Protein: 7 g/dL (ref 6.0–8.3)

## 2021-10-11 ENCOUNTER — Other Ambulatory Visit: Payer: Self-pay

## 2021-10-11 ENCOUNTER — Ambulatory Visit (HOSPITAL_COMMUNITY)
Admission: RE | Admit: 2021-10-11 | Discharge: 2021-10-11 | Disposition: A | Discharge status: Home / Self Care | Payer: Medicare Other | Source: Ambulatory Visit | Attending: Gastroenterology | Admitting: Gastroenterology

## 2021-10-11 DIAGNOSIS — K7031 Alcoholic cirrhosis of liver with ascites: Secondary | ICD-10-CM

## 2021-11-01 ENCOUNTER — Other Ambulatory Visit: Payer: Self-pay

## 2021-11-01 ENCOUNTER — Telehealth: Payer: Self-pay | Admitting: Gastroenterology

## 2021-11-01 DIAGNOSIS — R1084 Generalized abdominal pain: Secondary | ICD-10-CM

## 2021-11-01 DIAGNOSIS — K7031 Alcoholic cirrhosis of liver with ascites: Secondary | ICD-10-CM

## 2021-11-01 DIAGNOSIS — R195 Other fecal abnormalities: Secondary | ICD-10-CM

## 2021-11-01 NOTE — Telephone Encounter (Signed)
Patient called in with complaints of lower-mid abdominal pain for the last 12 days, rating 8 out of 10 pain scale. His last bowel movement was 3 days ago, and he noticed it was darker than usual but his wife thought it may have been from his iron supplement. Patient advised that until further MD recommendations, if symptoms worsen then to go to urgent care/ED.  ? ?Dr. Ardis Hughs, please advise. Thank you. ?

## 2021-11-01 NOTE — Telephone Encounter (Signed)
Patient called states he has abdominal pain along with dark/tar stool for approximately 12 days. Patient seeking advise.  ?

## 2021-11-01 NOTE — Telephone Encounter (Signed)
Spoke with patient regarding Dr. Christella Hartigan recommendations, and he is aware that he will need to come in for lab work today or tomorrow. Lab orders placed. ?

## 2021-11-02 ENCOUNTER — Other Ambulatory Visit (INDEPENDENT_AMBULATORY_CARE_PROVIDER_SITE_OTHER): Payer: Medicare PPO

## 2021-11-02 DIAGNOSIS — R1084 Generalized abdominal pain: Secondary | ICD-10-CM

## 2021-11-02 DIAGNOSIS — K7031 Alcoholic cirrhosis of liver with ascites: Secondary | ICD-10-CM

## 2021-11-02 DIAGNOSIS — R195 Other fecal abnormalities: Secondary | ICD-10-CM

## 2021-11-02 LAB — CBC WITH DIFFERENTIAL/PLATELET
Basophils Absolute: 0.1 10*3/uL (ref 0.0–0.1)
Basophils Relative: 1.2 % (ref 0.0–3.0)
Eosinophils Absolute: 0.3 10*3/uL (ref 0.0–0.7)
Eosinophils Relative: 5.9 % — ABNORMAL HIGH (ref 0.0–5.0)
HCT: 43.3 % (ref 39.0–52.0)
Hemoglobin: 14.8 g/dL (ref 13.0–17.0)
Lymphocytes Relative: 18.5 % (ref 12.0–46.0)
Lymphs Abs: 1 10*3/uL (ref 0.7–4.0)
MCHC: 34.2 g/dL (ref 30.0–36.0)
MCV: 85.3 fl (ref 78.0–100.0)
Monocytes Absolute: 0.3 10*3/uL (ref 0.1–1.0)
Monocytes Relative: 6 % (ref 3.0–12.0)
Neutro Abs: 3.8 10*3/uL (ref 1.4–7.7)
Neutrophils Relative %: 68.4 % (ref 43.0–77.0)
Platelets: 124 10*3/uL — ABNORMAL LOW (ref 150.0–400.0)
RBC: 5.07 Mil/uL (ref 4.22–5.81)
RDW: 19 % — ABNORMAL HIGH (ref 11.5–15.5)
WBC: 5.6 10*3/uL (ref 4.0–10.5)

## 2021-12-28 ENCOUNTER — Emergency Department (HOSPITAL_COMMUNITY): Payer: Medicare PPO

## 2021-12-28 ENCOUNTER — Emergency Department (HOSPITAL_COMMUNITY)
Admission: EM | Admit: 2021-12-28 | Discharge: 2021-12-28 | Disposition: A | Discharge status: Home / Self Care | Payer: Medicare PPO | Attending: Emergency Medicine | Admitting: Emergency Medicine

## 2021-12-28 ENCOUNTER — Other Ambulatory Visit: Payer: Self-pay

## 2021-12-28 ENCOUNTER — Encounter (HOSPITAL_COMMUNITY): Payer: Self-pay

## 2021-12-28 DIAGNOSIS — E119 Type 2 diabetes mellitus without complications: Secondary | ICD-10-CM | POA: Diagnosis not present

## 2021-12-28 DIAGNOSIS — I10 Essential (primary) hypertension: Secondary | ICD-10-CM | POA: Diagnosis not present

## 2021-12-28 DIAGNOSIS — Z794 Long term (current) use of insulin: Secondary | ICD-10-CM | POA: Insufficient documentation

## 2021-12-28 DIAGNOSIS — J449 Chronic obstructive pulmonary disease, unspecified: Secondary | ICD-10-CM | POA: Diagnosis not present

## 2021-12-28 DIAGNOSIS — K5900 Constipation, unspecified: Secondary | ICD-10-CM | POA: Diagnosis not present

## 2021-12-28 DIAGNOSIS — R1084 Generalized abdominal pain: Secondary | ICD-10-CM | POA: Diagnosis not present

## 2021-12-28 DIAGNOSIS — Z79899 Other long term (current) drug therapy: Secondary | ICD-10-CM | POA: Insufficient documentation

## 2021-12-28 DIAGNOSIS — R109 Unspecified abdominal pain: Secondary | ICD-10-CM | POA: Diagnosis present

## 2021-12-28 DIAGNOSIS — R110 Nausea: Secondary | ICD-10-CM | POA: Insufficient documentation

## 2021-12-28 LAB — CBC WITH DIFFERENTIAL/PLATELET
Abs Immature Granulocytes: 0.01 10*3/uL (ref 0.00–0.07)
Basophils Absolute: 0.1 10*3/uL (ref 0.0–0.1)
Basophils Relative: 1 %
Eosinophils Absolute: 0.4 10*3/uL (ref 0.0–0.5)
Eosinophils Relative: 5 %
HCT: 47 % (ref 39.0–52.0)
Hemoglobin: 15.6 g/dL (ref 13.0–17.0)
Immature Granulocytes: 0 %
Lymphocytes Relative: 23 %
Lymphs Abs: 1.9 10*3/uL (ref 0.7–4.0)
MCH: 28.7 pg (ref 26.0–34.0)
MCHC: 33.2 g/dL (ref 30.0–36.0)
MCV: 86.4 fL (ref 80.0–100.0)
Monocytes Absolute: 0.6 10*3/uL (ref 0.1–1.0)
Monocytes Relative: 7 %
Neutro Abs: 5.3 10*3/uL (ref 1.7–7.7)
Neutrophils Relative %: 64 %
Platelets: 129 10*3/uL — ABNORMAL LOW (ref 150–400)
RBC: 5.44 MIL/uL (ref 4.22–5.81)
RDW: 13.7 % (ref 11.5–15.5)
WBC: 8.3 10*3/uL (ref 4.0–10.5)
nRBC: 0 % (ref 0.0–0.2)

## 2021-12-28 LAB — URINALYSIS, ROUTINE W REFLEX MICROSCOPIC
Bilirubin Urine: NEGATIVE
Glucose, UA: 150 mg/dL — AB
Hgb urine dipstick: NEGATIVE
Ketones, ur: NEGATIVE mg/dL
Leukocytes,Ua: NEGATIVE
Nitrite: NEGATIVE
Protein, ur: NEGATIVE mg/dL
Specific Gravity, Urine: 1.009 (ref 1.005–1.030)
pH: 6 (ref 5.0–8.0)

## 2021-12-28 LAB — COMPREHENSIVE METABOLIC PANEL
ALT: 42 U/L (ref 0–44)
AST: 38 U/L (ref 15–41)
Albumin: 4.3 g/dL (ref 3.5–5.0)
Alkaline Phosphatase: 59 U/L (ref 38–126)
Anion gap: 8 (ref 5–15)
BUN: 9 mg/dL (ref 8–23)
CO2: 24 mmol/L (ref 22–32)
Calcium: 9.6 mg/dL (ref 8.9–10.3)
Chloride: 105 mmol/L (ref 98–111)
Creatinine, Ser: 1.07 mg/dL (ref 0.61–1.24)
GFR, Estimated: >60 mL/min (ref >60)
Glucose, Bld: 244 mg/dL — ABNORMAL HIGH (ref 70–99)
Potassium: 4 mmol/L (ref 3.5–5.1)
Sodium: 137 mmol/L (ref 135–145)
Total Bilirubin: 0.5 mg/dL (ref 0.3–1.2)
Total Protein: 7.6 g/dL (ref 6.5–8.1)

## 2021-12-28 LAB — LIPASE, BLOOD: Lipase: 96 U/L — ABNORMAL HIGH (ref 11–51)

## 2021-12-28 MED ORDER — HYDROMORPHONE HCL 1 MG/ML IJ SOLN
1.0000 mg | Freq: Once | INTRAMUSCULAR | Status: AC
  Administered 2021-12-28: 1 mg via INTRAVENOUS
  Filled 2021-12-28: qty 1

## 2021-12-28 MED ORDER — SODIUM CHLORIDE 0.9 % IV BOLUS
1000.0000 mL | Freq: Once | INTRAVENOUS | Status: AC
  Administered 2021-12-28: 1000 mL via INTRAVENOUS

## 2021-12-28 MED ORDER — DICYCLOMINE HCL 20 MG PO TABS: 20.0000 mg | ORAL_TABLET | Freq: Two times a day (BID) | ORAL | 0 refills | Status: DC

## 2021-12-28 MED ORDER — HYDROMORPHONE HCL 1 MG/ML IJ SOLN
0.5000 mg | Freq: Once | INTRAMUSCULAR | Status: AC
  Administered 2021-12-28: 0.5 mg via INTRAVENOUS
  Filled 2021-12-28: qty 0.5

## 2021-12-28 MED ORDER — IOHEXOL 300 MG/ML  SOLN
75.0000 mL | Freq: Once | INTRAMUSCULAR | Status: AC | PRN
  Administered 2021-12-28: 100 mL via INTRAVENOUS

## 2021-12-28 MED ORDER — ONDANSETRON HCL 4 MG/2ML IJ SOLN
4.0000 mg | Freq: Once | INTRAMUSCULAR | Status: AC
  Administered 2021-12-28: 4 mg via INTRAVENOUS
  Filled 2021-12-28: qty 2

## 2021-12-28 NOTE — Discharge Instructions (Signed)
Lab work and imaging are reassuring, I have given you a medication help with stomach spasms,  take as prescribed.  It is possible that you are becoming constipated causing worsening pain as you are on antibiotics please take MiraLAX this was help with constipation please stay hydrated. ? ?Follow-up with GI for further evaluation. ? ?Come back to the emergency department if you develop chest pain, shortness of breath, severe abdominal pain, uncontrolled nausea, vomiting, diarrhea. ? ?

## 2021-12-28 NOTE — ED Triage Notes (Signed)
Reports abd pain x 1 month.  Reports vomiting that started this morning.  Denies diarrhea.  Reports had pos c-diff x 3 weeks ago.  Denies diarrhea.  Resp even and unlabored.  Skin warm and dry.  Denies fever.  Hx of cirrhosis.   ?

## 2021-12-28 NOTE — ED Provider Notes (Signed)
?[facility] ?Provider Note ? ? ?CSN: [id number] ?Arrival date & time: 12/28/21  1058 ? ?  ? ?History ? ?Chief Complaint  ?Patient presents with  ? Abdominal Pain  ? ? ?Spencer Fletcher is a 61 y.o. male. ? ?HPI ? 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  Patient she is having stomach pain for last months time, states over the last few days has gotten a lot worse, he states he feels pain all over, does not localize, pain is constant does not rating to the back he has had associated nausea without vomiting he states he has had intermittent constipation but always feels like he has to have a bowel movement but only gets small stools out, denies melena or hematochezia denies diarrhea, he states he has some dysuria but denies difficulty with urination flank tenderness no nausea or vomiting no systemic infection like fevers or chills.  He endorses that he went to [facility] and he was apparently diagnosed with C. difficile and was restarted on vancomycin 1 week ago by his primary care provider.  He has no history of C. difficile no recent hospitalizations no recent antibiotic use. ? ?Wife at bedside able to validate the story.  Followed by GI has had an  colonoscopy which was unremarkable, EDG reveals distal esophagus varices as well as gastritis. ? ?Home Medications ?Prior to Admission medications   ?Medication Sig Start Date End Date Taking? Authorizing Provider  ?dicyclomine (BENTYL) 20 MG tablet Take 1 tablet (20 mg total) by mouth 2 (two) times daily. 12/28/21  Yes Marcello Fennel, PA-C  ?arformoterol (BROVANA) 15 MCG/2ML NEBU 2 (two) times daily. 09/28/21   [provider]  ?budesonide (PULMICORT) 0.5 MG/2ML nebulizer solution Take 0.5 mg by nebulization daily.    [provider]  ?buPROPion (WELLBUTRIN XL) 150 MG 24 hr tablet Take 150 mg  by mouth daily.    [provider]  ?citalopram (CELEXA) 10 MG tablet Take 10 mg by mouth at bedtime.  10/27/19   [provider]  ?clonazePAM (KLONOPIN) 0.5 MG tablet Take 0.5 mg by mouth at bedtime as needed. 10/07/18   [provider]  ?clopidogrel (PLAVIX) 75 MG tablet Take 75 mg by mouth daily.    [provider]  ?Cyanocobalamin (VITAMIN B-12 PO) Take 1 capsule by mouth daily.    [provider]  ?FERROUS SULFATE PO Take 1 tablet by mouth daily.    [provider]  ?folic acid (FOLVITE) 1 MG tablet Take 1 mg by mouth daily.    [provider]  ?furosemide (LASIX) 20 MG tablet Take 20 mg by mouth as needed.    [provider]  ?insulin glargine, 2 Unit Dial, (TOUJEO MAX SOLOSTAR) 300 UNIT/ML Solostar Pen Inject 80 Units into the skin every morning. And pen needles 2/day ?Patient taking differently: Inject 62 Units into the skin every morning. And pen needles 2/day 01/12/21   Renato Shin, MD  ?insulin lispro (HUMALOG KWIKPEN) 100 UNIT/ML KwikPen Inject 50 Units into the skin daily with breakfast. 01/12/21   Renato Shin, MD  ?naloxone [facility]) 4 MG/0.1ML LIQD nasal spray kit Place 1 spray into the nose as needed (accidental overdose.).  09/23/18   [provider]  ?pantoprazole (PROTONIX) 40 MG tablet Take 40 mg by mouth 2 (two) times daily.    [provider]  ?pregabalin (LYRICA) 200 MG capsule Take 200 mg  by mouth in the morning, at noon, and at bedtime.  10/16/18   [provider]  ?propranolol (INDERAL) 10 MG tablet TAKE 1 TABLET BY MOUTH THREE TIMES A DAY ?Patient taking differently: Take 20 mg every morning, 10 mg every evening 08/26/21   Milus Banister, MD  ?rosuvastatin (CRESTOR) 10 MG tablet Take 10 mg by mouth daily.    [provider]  ?Semaglutide (OZEMPIC, 1 MG/DOSE, [place]) Inject 1 Dose into the skin once a week.    [provider]  ?thiamine (VITAMIN B-1) 100 MG tablet Take 100 mg by mouth  daily.    [provider]  ?triamcinolone cream (KENALOG) 0.1 % daily as needed. 02/14/21   [provider]  ?XTAMPZA ER 9 MG C12A Take 1 capsule by mouth in the morning, at noon, and at bedtime. 03/28/20   [provider]  ?   ? ?Allergies    ?Ambien [zolpidem], Carafate [sucralfate], and Lexapro [escitalopram oxalate]   ? ?Review of Systems   ?Review of Systems  ?Constitutional:  Negative for chills and fever.  ?Respiratory:  Negative for shortness of breath.   ?Cardiovascular:  Negative for chest pain.  ?Gastrointestinal:  Positive for abdominal pain, constipation and nausea. Negative for blood in stool and vomiting.  ?Neurological:  Negative for headaches.  ? ?Physical Exam ?Updated Vital Signs ?BP (!) 140/100   Pulse 64   Temp 97.6 ?F (36.4 ?C) (Oral)   Resp 17   Ht _0  (1.753 m)   Wt 97.1 kg   SpO2 99%   BMI 31.60 kg/m?  ?Physical Exam ?Vitals and nursing note reviewed.  ?Constitutional:   ?   General: He is not in acute distress. ?   Appearance: He is not ill-appearing.  ?HENT:  ?   Head: Normocephalic and atraumatic.  ?   Nose: No congestion.  ?Eyes:  ?   Conjunctiva/sclera: Conjunctivae normal.  ?Cardiovascular:  ?   Rate and Rhythm: Normal rate and regular rhythm.  ?   Pulses: Normal pulses.  ?   Heart sounds: No murmur heard. ?  No friction rub. No gallop.  ?Pulmonary:  ?   Effort: No respiratory distress.  ?   Breath sounds: No wheezing, rhonchi or rales.  ?Abdominal:  ?   Palpations: Abdomen is soft.  ?   Tenderness: There is abdominal tenderness. There is no right CVA tenderness or left CVA tenderness.  ?   Comments: Abdomen nondistended no active bowel sounds although percussion, slight guarding my exam he has not focalized tenderness, there is no rebound tenderness or peritoneal sign negative flank tenderness or CVA tenderness.  ?Musculoskeletal:  ?   Comments: Has 3 surgical scars from prior back surgeries in his lower lumbar region no evidence of infection, spine  was palpated slightly tender in the lower lumbar region, no step-off or deformities noted.  Moving all 4 without difficulty.  ?Skin: ?   General: Skin is warm and dry.  ?Neurological:  ?   Mental Status: He is alert.  ?Psychiatric:     ?   Mood and Affect: Mood normal.  ? ? ?ED Results / Procedures / Treatments   ?Labs ?(all labs ordered are listed, but only abnormal results are displayed) ?Labs Reviewed  ?COMPREHENSIVE METABOLIC PANEL - Abnormal; Notable for the following components:  ?    Result Value  ? Glucose, Bld 244 (*)   ? All other components within normal limits  ?LIPASE, BLOOD - Abnormal; Notable for the following  components:  ? Lipase 96 (*)   ? All other components within normal limits  ?CBC WITH DIFFERENTIAL/PLATELET - Abnormal; Notable for the following components:  ? Platelets 129 (*)   ? All other components within normal limits  ?URINALYSIS, ROUTINE W REFLEX MICROSCOPIC - Abnormal; Notable for the following components:  ? Glucose, UA 150 (*)   ? All other components within normal limits  ?GASTROINTESTINAL PANEL BY PCR, STOOL (REPLACES STOOL CULTURE)  ? ? ?EKG ?None ? ?Radiology ?CT Abdomen Pelvis W Contrast ? ?Result Date: 12/28/2021 ?CLINICAL DATA:  Abdominal pain and vomiting. History of C diff colitis. Cirrhosis. EXAM: CT ABDOMEN AND PELVIS WITH CONTRAST TECHNIQUE: Multidetector CT imaging of the abdomen and pelvis was performed using the standard protocol following bolus administration of intravenous contrast. RADIATION DOSE REDUCTION: This exam was performed according to the departmental dose-optimization program which includes automated exposure control, adjustment of the mA and/or kV according to patient size and/or use of iterative reconstruction technique. CONTRAST:  143m OMNIPAQUE IOHEXOL 300 MG/ML  SOLN COMPARISON:  MR abdomen 12/09/2019, CT abdomen pelvis 12/11/2019. FINDINGS: Lower chest: Minimal dependent atelectasis bilaterally. Heart size normal. No pericardial or pleural effusion.  Atherosclerotic calcification of the aorta and coronary arteries. Hepatobiliary: Liver margin is irregular. 6 mm low-attenuation lesion in segment 4 (2/21), too small to characterize but unchanged from 04/22/2

## 2021-12-29 NOTE — Progress Notes (Addendum)
? ? ?12/30/2021 ?PAXTON CARLONE ?CE:[id number] ?[birth year] ? ?Referring provider: Sheryle Hail, [place] ?Primary GI doctor: Dr. Ardis Hughs ? ?ASSESSMENT AND PLAN:  ? ?Generalized abdominal pain with dark stools in patient with alcoholic cirrhosis, no dark stools x 2 weeks.  ?61 year old male with history of alcoholic cirrhosis, history of EGD 2021 with varices, was on ibuprofen up until 09/2021 for dental pain. ?Started on Ozempic 10/2021 for diabetes which was poorly controlled. ?1 months of severe epigastric pain, at that time had diarrhea with very dark stools. ?Has not had fever but has had some chills. ?Supposedly had CT at [facility] unable to see these notes was diagnosed with pancreatitis. ?Recent CT showed normal pancreas on 12/28/2021 but patient continues to have severe diffuse abdominal pain worse epigastric has been on liquid diet for the last month.  Occasional dark stools. ?Has been on reglan 3 x a day without help for 6 weeks.  ?Has been on bentyl that is not helping.  ? ?With history slightly concerned for variceal bleed but less likely with normal CBC concerned for, peptic ulcer disease, gastritis in setting of cirrhosis.  ?Has failed PPI BID, reglan, allergy to carafate.  ?? Pancreatitis from ozempic, suggest stopping.  ? ?ADDENDUM: [facility] ER nurse called back and talked with her.  ? ?Getting labs on the patient today however with severe pain and possible dark stools, patient also needs to have endoscopy in the hospital, to help expedite treatment discussed going to the hospital with patient, they perfer to do this.  ?Strict ER cautions with the patient, will send message to inpatient team for this week and Dr. Ardis Hughs and Nicoletta Ba PA. ?Patient would likely need endoscopy and possible diagnostic paracentesis for SBP. ?Possible GLP-1 induced pancreatitis however this appears to be resolved/resolving and continues to have symptoms. ?Discussed stopping GLP-1 for now and discussing with primary care about  alternative in case this is causing potential gastroparesis, worsening GERD, pancreatitis. ?Will treat constipation, will give information about gastroparesis diet, needs to get better control of sugar ?Try to get medical records from [place] where per patient had Cdiff and CT ?-     CBC with Differential/Platelet; Future ?-     Comprehensive metabolic panel; Future ?-     Triglycerides; Future ?-     Lipase; Future ?-     Cancel: US Paracentesis; Future ?-     DG Abd 1 View; Future ? ?Alcoholic cirrhosis of liver with ascites ([place]) ?-     Protime-INR; Future ?-     AFP tumor marker; Future ?-     Cancel: US Paracentesis; Future- may get inpatient.  ?12/28/2021 WBC 8.3 HGB 15.6 MCV 86.4 Platelets 129 ?12/28/2021 AST 38 ALT 42 Alkphos 59 TBili 0.5 ?10/04/2021 INR 1.1 ?Last AFP 05/10/2021 2.5  ? ?MELD-Na score: 9  based off of labs 10/04/2021 ?-Hodgkins screening due 06/2022 ?- will need to get AFP ?-Hepatic encephalopathy- there is not evidence of hepatic encephalopathy ?-Ascites no evidence on CT scan 12/2021 ?-Nutrition and low sodium diet discussed with patient and information given ?-Continue daily multivitamin ?-Recommended 30 minutes of aerobic and resistance exercise 3 days/week ? ? ?Type 2 diabetes mellitus with stage 3 chronic kidney disease, with long-term current use of insulin, unspecified whether stage 3a or 3b CKD ([place]) ?May need to stop ozempic ? ? ?History of Present Illness:  ?61 y.o. male with medical history significant for stage III kidney disease, COPD, diabetes, hypertension, hyperlipidemia hypertension, history of CAD/PAD status post abdominal aortic endovascular stent  graft 2023 presents for follow up of cirrhosis secondary to ETOH, presents for ER follow up for AB pain.  ? ?Patient was seen in the ER 12/30/2021 for abdominal pain nausea without vomiting, intermittent constipation.   ?Denied melena or hematochezia.   ?Patient states diagnosed with C. difficile in [place] and was given vancomycin  recently. ?Patient had unremarkable CBC, CMET other than glucose 244, lipase 96 urine unremarkable. ?12/28/2021 CT abdomen pelvis with contrast for abdominal pain showed cirrhosis, cholecystectomy, 6 mm low-attenuation lesion unchanged from 12/11/2019 likely benign, no biliary ductal dilatation, normal pancreas normal spleen.  Normal stomach colon.  Gastroesophageal varices evident with haziness unchanged from 12/11/2019. NO ASCITES. Does show some stool descending colon/rectum. ? ?Patient states 1 month ago started to have lower AB pain traveling up to epigastric and his sides. Some on left side.  ?Went to [facility] in [place] a month ago, states had non contrast CT and labs, states had pancreatitis.  Has been on clear liquid diet for a month.  ?Then went to [facility] and told pancreatitis.  ?Ct showed normal pancrease.  ?States he lays in recliner and her hurts, has cried due to the pain, he has been getting up to 2 AM-4AM due to pain and unable to sleep.  ?Constant but worse lying down, worse with movement, worse with food. ?Tried food once in last month last Wednesday, hamburger and gray and had worse AB and vomiting.   ?Last BM was this AM, 2 x a day, soft formed stools.  Had dark black stool when first started, lasted 2-3 days with really bad diarrhea.  ?He gets full very quickly just drinking water x 2 weeks.  ?Denies GERD, trouble swallowing.  ?Has been on ozempic x 2 months, stopped for 1 week only but has started back on it.  ? ?Per patient last alcohol intake was 2 years ago.  ?Patient last seen in the office 10/04/2021, MELD 11 based off 03/2021 labs. ?Was placed on Plavix secondary to PAD status post stents and ASA.  ?Was complaining of dental pain taking 10-15 ibuprofen daily, he has since had his teeth pulled and has not been on NSAIDS.  ? ?Patient does have history of varices and thrombocytopenia, no splenomegaly.   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   ?Nonspecific gastritis which was H. pylori negative on biopsy.   ?Propranolol 10mg  tid started, on 20 in the AM and 10 at night.  ?Patient does not have history of encephalopathy. ?He feels he has some fluid in lungs when he lays down, has been about 3 times a week on lasix from cardiology. No AB swelling or leg swelling.  ?He is not on spironolactone ?Wt Readings from Last 3 Encounters:  ?12/30/21 214 lb 12.8 oz (97.4 kg)  ?12/28/21 214 lb (97.1 kg)  ?10/04/21 221 lb 6.4 oz (100.4 kg)  ? ?Last [place] screen: CT AB and pelvis 12/28/2021 (10/11/2021 RUQ [place]) ?Last AFP 05/10/2021 2.5  ?Last INR: 10/04/2021 1.1  ? ?Hepatitis immunity status:  ?02/04/2020 HepA NON-REACTIVE  ?02/04/2020 HepBsAG NON-REACTIVE  ?02/04/2020 HepAsAB REACTIVE  ?02/04/2020 HepCAB NON-REACTIVE  ? ? Colonoscopy April 2021 found no polyps or cancers.  He did have prominent rectal vessels which may be portal hypertension related. ? ?Current Medications:  ? ?Current Outpatient Medications (Endocrine & Metabolic):  ?  insulin glargine, 2 Unit Dial, (TOUJEO MAX SOLOSTAR) 300 UNIT/ML Solostar Pen, Inject 80 Units into the skin every  morning. And pen needles 2/day (Patient taking differently: Inject 62 Units into the skin every morning. And pen needles 2/day) ?  insulin lispro (HUMALOG KWIKPEN) 100 UNIT/ML KwikPen, Inject 50 Units into the skin daily with breakfast. ?  Semaglutide (OZEMPIC, 1 MG/DOSE, [place]), Inject 1 Dose into the skin once a week. ? ?Current Outpatient Medications (Cardiovascular):  ?  furosemide (LASIX) 20 MG tablet, Take 20 mg by mouth as needed. ?  propranolol (INDERAL) 10 MG tablet, TAKE 1 TABLET BY MOUTH THREE TIMES A DAY (Patient taking differently: Take 20 mg every morning, 10 mg every evening) ?  rosuvastatin (CRESTOR) 10 MG tablet, Take 10 mg by mouth daily. ? ?Current Outpatient Medications (Respiratory):  ?  arformoterol (BROVANA) 15 MCG/2ML NEBU, 2 (two) times daily. ?  budesonide (PULMICORT) 0.5  MG/2ML nebulizer solution, Take 0.5 mg by nebulization daily. ? ?Current Outpatient Medications (Analgesics):  ?  XTAMPZA ER 9 MG C12A, Take 1 capsule by mouth in the morning, at noon, and at bedtime. ? ?[place]

## 2021-12-30 ENCOUNTER — Encounter: Payer: Self-pay | Admitting: Physician Assistant

## 2021-12-30 ENCOUNTER — Ambulatory Visit (INDEPENDENT_AMBULATORY_CARE_PROVIDER_SITE_OTHER)
Admission: RE | Admit: 2021-12-30 | Discharge: 2021-12-30 | Disposition: A | Discharge status: Home / Self Care | Payer: Medicare PPO | Source: Ambulatory Visit | Attending: Physician Assistant | Admitting: Physician Assistant

## 2021-12-30 ENCOUNTER — Ambulatory Visit (INDEPENDENT_AMBULATORY_CARE_PROVIDER_SITE_OTHER): Payer: Medicare PPO | Admitting: Physician Assistant

## 2021-12-30 ENCOUNTER — Other Ambulatory Visit (INDEPENDENT_AMBULATORY_CARE_PROVIDER_SITE_OTHER): Payer: Medicare PPO

## 2021-12-30 VITALS — BP 110/78 | HR 69 | Ht 69.0 in | Wt 214.8 lb

## 2021-12-30 DIAGNOSIS — K7031 Alcoholic cirrhosis of liver with ascites: Secondary | ICD-10-CM

## 2021-12-30 DIAGNOSIS — R1084 Generalized abdominal pain: Secondary | ICD-10-CM

## 2021-12-30 DIAGNOSIS — N183 Chronic kidney disease, stage 3 unspecified: Secondary | ICD-10-CM

## 2021-12-30 DIAGNOSIS — E1122 Type 2 diabetes mellitus with diabetic chronic kidney disease: Secondary | ICD-10-CM

## 2021-12-30 DIAGNOSIS — R195 Other fecal abnormalities: Secondary | ICD-10-CM

## 2021-12-30 DIAGNOSIS — Z794 Long term (current) use of insulin: Secondary | ICD-10-CM

## 2021-12-30 LAB — CBC WITH DIFFERENTIAL/PLATELET
Basophils Absolute: 0.1 10*3/uL (ref 0.0–0.1)
Basophils Relative: 1.2 % (ref 0.0–3.0)
Eosinophils Absolute: 0.3 10*3/uL (ref 0.0–0.7)
Eosinophils Relative: 3.7 % (ref 0.0–5.0)
HCT: 48.3 % (ref 39.0–52.0)
Hemoglobin: 16 g/dL (ref 13.0–17.0)
Lymphocytes Relative: 22.6 % (ref 12.0–46.0)
Lymphs Abs: 2.1 10*3/uL (ref 0.7–4.0)
MCHC: 33.1 g/dL (ref 30.0–36.0)
MCV: 85.9 fl (ref 78.0–100.0)
Monocytes Absolute: 0.6 10*3/uL (ref 0.1–1.0)
Monocytes Relative: 6.8 % (ref 3.0–12.0)
Neutro Abs: 6.1 10*3/uL (ref 1.4–7.7)
Neutrophils Relative %: 65.7 % (ref 43.0–77.0)
Platelets: 131 10*3/uL — ABNORMAL LOW (ref 150.0–400.0)
RBC: 5.62 Mil/uL (ref 4.22–5.81)
RDW: 14.1 % (ref 11.5–15.5)
WBC: 9.3 10*3/uL (ref 4.0–10.5)

## 2021-12-30 LAB — COMPREHENSIVE METABOLIC PANEL
ALT: 42 U/L (ref 0–53)
AST: 27 U/L (ref 0–37)
Albumin: 4.8 g/dL (ref 3.5–5.2)
Alkaline Phosphatase: 73 U/L (ref 39–117)
BUN: 10 mg/dL (ref 6–23)
CO2: 27 mEq/L (ref 19–32)
Calcium: 10.5 mg/dL (ref 8.4–10.5)
Chloride: 101 mEq/L (ref 96–112)
Creatinine, Ser: 1.22 mg/dL (ref 0.40–1.50)
GFR: 64.15 mL/min (ref >60.00)
Glucose, Bld: 225 mg/dL — ABNORMAL HIGH (ref 70–99)
Potassium: 4.1 mEq/L (ref 3.5–5.1)
Sodium: 136 mEq/L (ref 135–145)
Total Bilirubin: 0.6 mg/dL (ref 0.2–1.2)
Total Protein: 7.8 g/dL (ref 6.0–8.3)

## 2021-12-30 LAB — TRIGLYCERIDES: Triglycerides: 215 mg/dL — ABNORMAL HIGH (ref 0.0–149.0)

## 2021-12-30 LAB — PROTIME-INR
INR: 1.1 ratio — ABNORMAL HIGH (ref 0.8–1.0)
Prothrombin Time: 12.2 s (ref 9.6–13.1)

## 2021-12-30 LAB — LIPASE: Lipase: 98 U/L — ABNORMAL HIGH (ref 11.0–59.0)

## 2021-12-30 MED ORDER — HYOSCYAMINE SULFATE 0.125 MG SL SUBL: 0.1250 mg | SUBLINGUAL_TABLET | Freq: Four times a day (QID) | SUBLINGUAL | 0 refills | Status: AC | PRN

## 2021-12-30 NOTE — Progress Notes (Signed)
I agree with the above note, plan.  Doubt serious GI bleeding since his Hb was normal two days ago.   ?

## 2021-12-30 NOTE — Addendum Note (Signed)
Addended by: Quentin Mulling on: 12/30/2021 04:13 PM ? ? Modules accepted: Orders ? ?

## 2021-12-30 NOTE — Patient Instructions (Addendum)
Will go to the ER if they have any melena (black stool), hematochezia (blood in stool), coffee ground vomiting, severe pain, severe shortness of breath or chest pain.  ? ?Will set up endoscopy ?If negative can consider gastric emptying study ?Would suggest talking with PCP about changes/stopping ozempic ? ?Gastroparesis ?Please do small frequent meals like 4-6 meals a day.  ?Eat and drink liquids at separate times.  ?Avoid high fiber foods, cook your vegetables, avoid high fat food.  ?Suggest spreading protein throughout the day (greek yogurt, glucerna, soft meat, milk, eggs) ?Choose soft foods that you can mash with a fork ?When you are more symptomatic, change to pureed foods foods and liquids.  ?Consider reading "Living well with Gastroparesis" by Reuel Derby ?Gastroparesis is a condition in which food takes longer than normal to empty from the stomach. This condition is also known as delayed gastric emptying. It is usually a long-term (chronic) condition. ?There is no cure, but there are treatments and things that you can do at home to help relieve symptoms. Treating the underlying condition that causes gastroparesis can also help relieve symptoms ?What are the causes? ?In many cases, the cause of this condition is not known. Possible causes include: ?A hormone (endocrine) disorder, such as hypothyroidism or diabetes. ?A nervous system disease, such as Parkinson's disease or multiple sclerosis. ?Cancer, infection, or surgery that affects the stomach or vagus nerve. The vagus nerve runs from your chest, through your neck, and to the lower part of your brain. ?A connective tissue disorder, such as scleroderma. ?Certain medicines. ?What increases the risk? ?You are more likely to develop this condition if: ?You have certain disorders or diseases. These may include: ?An endocrine disorder. ?An eating disorder. ?Amyloidosis. ?Scleroderma. ?Parkinson's disease. ?Multiple sclerosis. ?Cancer or infection of the  stomach or the vagus nerve. ?You have had surgery on your stomach or vagus nerve. ?You take certain medicines. ?You are male. ?What are the signs or symptoms? ?Symptoms of this condition include: ?Feeling full after eating very little or a loss of appetite. ?Nausea, vomiting, or heartburn. ?Bloating of your abdomen. ?Inconsistent blood sugar (glucose) levels on blood tests. ?Unexplained weight loss. ?Acid from the stomach coming up into the esophagus (gastroesophageal reflux). ?Sudden tightening (spasm) of the stomach, which can be painful. ?Symptoms may come and go. Some people may not notice any symptoms. ?How is this diagnosed? ?This condition is diagnosed with tests, such as: ?Tests that check how long it takes food to move through the stomach and intestines. These tests include: ?Upper gastrointestinal (GI) series. For this test, you drink a liquid that shows up well on X-rays, and then X-rays are taken of your intestines. ?Gastric emptying scintigraphy. For this test, you eat food that contains a small amount of radioactive material, and then scans are taken. ?Wireless capsule GI monitoring system. For this test, you swallow a pill (capsule) that records information about how foods and fluid move through your stomach. ?Gastric manometry. For this test, a tube is passed down your throat and into your stomach to measure electrical and muscular activity. ?Endoscopy. For this test, a long, thin tube with a camera and light on the end is passed down your throat and into your stomach to check for problems in your stomach lining. ?Ultrasound. This test uses sound waves to create images of the inside of your body. This can help rule out gallbladder disease or pancreatitis as a cause of your symptoms. ?How is this treated? ?There is no cure for this  condition, but treatment and home care may relieve symptoms. Treatment may include: ?Treating the underlying cause. ?Managing your symptoms by making changes to your diet  and exercise habits. ?Taking medicines to control nausea and vomiting and to stimulate stomach muscles. ?Getting food through a feeding tube in the hospital. This may be done in severe cases. ?Having surgery to insert a device called a gastric electrical stimulator into your body. This device helps improve stomach emptying and control nausea and vomiting. ?Follow these instructions at home: ?Take over-the-counter and prescription medicines only as told by your health care provider. ?Follow instructions from your health care provider about eating or drinking restrictions. Your health care provider may recommend that you: ?Eat smaller meals more often. ?Eat low-fat foods. ?Eat low-fiber forms of high-fiber foods. For example, eat cooked vegetables instead of raw vegetables. ?Have only liquid foods instead of solid foods. Liquid foods are easier to digest. ?Drink enough fluid to keep your urine pale yellow. ?Exercise as often as told by your health care provider. ?Keep all follow-up visits. This is important. ?Contact a health care provider if you: ?Notice that your symptoms do not improve with treatment. ?Have new symptoms. ?Get help right away if you: ?Have severe pain in your abdomen that does not improve with treatment. ?Have nausea that is severe or does not go away. ?Vomit every time you drink fluids. ?Summary ?Gastroparesis is a long-term (chronic) condition in which food takes longer than normal to empty from the stomach. ?Symptoms include nausea, vomiting, heartburn, bloating of your abdomen, and loss of appetite. ?Eating smaller portions, low-fat foods, and low-fiber forms of high-fiber foods may help you manage your symptoms. ?Get help right away if you have severe pain in your abdomen. ?This information is not intended to replace advice given to you by your health care provider. Make sure you discuss any questions you have with your health care provider. ?Document Revised: 12/15/2019 Document Reviewed:  12/15/2019 ?Elsevier Patient Education ? 2021 Elsevier Inc. ? ? ?Your provider has requested that you go to the basement level for lab work before leaving today. Press "B" on the elevator. The lab is located at the first door on the left as you exit the elevator. ? ? ?Due to recent changes in healthcare laws, you may see the results of your imaging and laboratory studies on MyChart before your provider has had a chance to review them.  We understand that in some cases there may be results that are confusing or concerning to you. Not all laboratory results come back in the same time frame and the provider may be waiting for multiple results in order to interpret others.  Please give [place] 48 hours in order for your provider to thoroughly review all the results before contacting the office for clarification of your results.  ? ?I appreciate the opportunity to care for you. ?Quentin Mulling, PA-C ?

## 2021-12-31 ENCOUNTER — Observation Stay (HOSPITAL_COMMUNITY): Payer: Medicare PPO

## 2021-12-31 ENCOUNTER — Other Ambulatory Visit: Payer: Self-pay

## 2021-12-31 ENCOUNTER — Observation Stay (HOSPITAL_COMMUNITY)
Admission: EM | Admit: 2021-12-31 | Discharge: 2022-01-01 | Disposition: A | Discharge status: Home / Self Care | Payer: Medicare PPO | Attending: Internal Medicine | Admitting: Internal Medicine

## 2021-12-31 DIAGNOSIS — Z91040 Latex allergy status: Secondary | ICD-10-CM | POA: Insufficient documentation

## 2021-12-31 DIAGNOSIS — E1122 Type 2 diabetes mellitus with diabetic chronic kidney disease: Secondary | ICD-10-CM | POA: Insufficient documentation

## 2021-12-31 DIAGNOSIS — J449 Chronic obstructive pulmonary disease, unspecified: Secondary | ICD-10-CM | POA: Insufficient documentation

## 2021-12-31 DIAGNOSIS — N1831 Chronic kidney disease, stage 3a: Secondary | ICD-10-CM | POA: Diagnosis not present

## 2021-12-31 DIAGNOSIS — F17210 Nicotine dependence, cigarettes, uncomplicated: Secondary | ICD-10-CM | POA: Insufficient documentation

## 2021-12-31 DIAGNOSIS — Z20822 Contact with and (suspected) exposure to covid-19: Secondary | ICD-10-CM | POA: Diagnosis not present

## 2021-12-31 DIAGNOSIS — K7031 Alcoholic cirrhosis of liver with ascites: Secondary | ICD-10-CM | POA: Diagnosis not present

## 2021-12-31 DIAGNOSIS — E669 Obesity, unspecified: Secondary | ICD-10-CM | POA: Diagnosis not present

## 2021-12-31 DIAGNOSIS — N183 Chronic kidney disease, stage 3 unspecified: Secondary | ICD-10-CM | POA: Diagnosis present

## 2021-12-31 DIAGNOSIS — F419 Anxiety disorder, unspecified: Secondary | ICD-10-CM | POA: Diagnosis present

## 2021-12-31 DIAGNOSIS — Z79899 Other long term (current) drug therapy: Secondary | ICD-10-CM | POA: Insufficient documentation

## 2021-12-31 DIAGNOSIS — Z95828 Presence of other vascular implants and grafts: Secondary | ICD-10-CM | POA: Insufficient documentation

## 2021-12-31 DIAGNOSIS — R109 Unspecified abdominal pain: Secondary | ICD-10-CM | POA: Diagnosis present

## 2021-12-31 DIAGNOSIS — Z7902 Long term (current) use of antithrombotics/antiplatelets: Secondary | ICD-10-CM | POA: Insufficient documentation

## 2021-12-31 DIAGNOSIS — Z7985 Long-term (current) use of injectable non-insulin antidiabetic drugs: Secondary | ICD-10-CM | POA: Insufficient documentation

## 2021-12-31 DIAGNOSIS — E1165 Type 2 diabetes mellitus with hyperglycemia: Secondary | ICD-10-CM | POA: Insufficient documentation

## 2021-12-31 DIAGNOSIS — G894 Chronic pain syndrome: Secondary | ICD-10-CM

## 2021-12-31 DIAGNOSIS — K7460 Unspecified cirrhosis of liver: Secondary | ICD-10-CM | POA: Diagnosis present

## 2021-12-31 DIAGNOSIS — Z6830 Body mass index (BMI) 30.0-30.9, adult: Secondary | ICD-10-CM | POA: Diagnosis not present

## 2021-12-31 DIAGNOSIS — Z794 Long term (current) use of insulin: Secondary | ICD-10-CM | POA: Insufficient documentation

## 2021-12-31 DIAGNOSIS — I129 Hypertensive chronic kidney disease with stage 1 through stage 4 chronic kidney disease, or unspecified chronic kidney disease: Secondary | ICD-10-CM | POA: Diagnosis not present

## 2021-12-31 DIAGNOSIS — R748 Abnormal levels of other serum enzymes: Secondary | ICD-10-CM | POA: Diagnosis not present

## 2021-12-31 DIAGNOSIS — K852 Alcohol induced acute pancreatitis without necrosis or infection: Secondary | ICD-10-CM

## 2021-12-31 DIAGNOSIS — G4733 Obstructive sleep apnea (adult) (pediatric): Secondary | ICD-10-CM | POA: Diagnosis present

## 2021-12-31 DIAGNOSIS — E785 Hyperlipidemia, unspecified: Secondary | ICD-10-CM | POA: Diagnosis present

## 2021-12-31 DIAGNOSIS — K859 Acute pancreatitis without necrosis or infection, unspecified: Principal | ICD-10-CM | POA: Insufficient documentation

## 2021-12-31 DIAGNOSIS — R112 Nausea with vomiting, unspecified: Secondary | ICD-10-CM

## 2021-12-31 DIAGNOSIS — R1013 Epigastric pain: Secondary | ICD-10-CM

## 2021-12-31 LAB — CBC
HCT: 49 % (ref 39.0–52.0)
HCT: 43.4 % (ref 39.0–52.0)
Hemoglobin: 16.6 g/dL (ref 13.0–17.0)
Hemoglobin: 14.5 g/dL (ref 13.0–17.0)
MCH: 29.3 pg (ref 26.0–34.0)
MCH: 29.3 pg (ref 26.0–34.0)
MCHC: 33.9 g/dL (ref 30.0–36.0)
MCHC: 33.4 g/dL (ref 30.0–36.0)
MCV: 86.4 fL (ref 80.0–100.0)
MCV: 87.7 fL (ref 80.0–100.0)
Platelets: 141 10*3/uL — ABNORMAL LOW (ref 150–400)
Platelets: 118 10*3/uL — ABNORMAL LOW (ref 150–400)
RBC: 5.67 MIL/uL (ref 4.22–5.81)
RBC: 4.95 MIL/uL (ref 4.22–5.81)
RDW: 13.7 % (ref 11.5–15.5)
RDW: 13.7 % (ref 11.5–15.5)
WBC: 10 10*3/uL (ref 4.0–10.5)
WBC: 8.7 10*3/uL (ref 4.0–10.5)
nRBC: 0 % (ref 0.0–0.2)
nRBC: 0 % (ref 0.0–0.2)

## 2021-12-31 LAB — LIPID PANEL
Cholesterol: 81 mg/dL (ref 0–200)
HDL: 27 mg/dL — ABNORMAL LOW (ref >40)
LDL Cholesterol: 21 mg/dL (ref 0–99)
Total CHOL/HDL Ratio: 3 RATIO
Triglycerides: 163 mg/dL — ABNORMAL HIGH (ref <150)
VLDL: 33 mg/dL (ref 0–40)

## 2021-12-31 LAB — HEMOGLOBIN A1C
Hgb A1c MFr Bld: 8 % — ABNORMAL HIGH (ref 4.8–5.6)
Mean Plasma Glucose: 182.9 mg/dL

## 2021-12-31 LAB — RESP PANEL BY RT-PCR (FLU A&B, COVID) ARPGX2
Influenza A by PCR: NEGATIVE
Influenza B by PCR: NEGATIVE
SARS Coronavirus 2 by RT PCR: NEGATIVE

## 2021-12-31 LAB — URINALYSIS, ROUTINE W REFLEX MICROSCOPIC
Bacteria, UA: NONE SEEN
Bilirubin Urine: NEGATIVE
Glucose, UA: NEGATIVE mg/dL
Hgb urine dipstick: NEGATIVE
Ketones, ur: NEGATIVE mg/dL
Leukocytes,Ua: NEGATIVE
Nitrite: NEGATIVE
Protein, ur: 30 mg/dL — AB
Specific Gravity, Urine: 1.016 (ref 1.005–1.030)
pH: 6 (ref 5.0–8.0)

## 2021-12-31 LAB — TSH: TSH: 2.655 u[IU]/mL (ref 0.350–4.500)

## 2021-12-31 LAB — PROTIME-INR
INR: 1.3 — ABNORMAL HIGH (ref 0.8–1.2)
Prothrombin Time: 15.9 seconds — ABNORMAL HIGH (ref 11.4–15.2)

## 2021-12-31 LAB — LACTIC ACID, PLASMA
Lactic Acid, Venous: 1.3 mmol/L (ref 0.5–1.9)
Lactic Acid, Venous: 2 mmol/L (ref 0.5–1.9)
Lactic Acid, Venous: 1.4 mmol/L (ref 0.5–1.9)

## 2021-12-31 LAB — CREATININE, SERUM
Creatinine, Ser: 1.08 mg/dL (ref 0.61–1.24)
GFR, Estimated: >60 mL/min (ref >60)

## 2021-12-31 LAB — COMPREHENSIVE METABOLIC PANEL
ALT: 40 U/L (ref 0–44)
AST: 40 U/L (ref 15–41)
Albumin: 4.7 g/dL (ref 3.5–5.0)
Alkaline Phosphatase: 69 U/L (ref 38–126)
Anion gap: 9 (ref 5–15)
BUN: 15 mg/dL (ref 8–23)
CO2: 23 mmol/L (ref 22–32)
Calcium: 10.4 mg/dL — ABNORMAL HIGH (ref 8.9–10.3)
Chloride: 105 mmol/L (ref 98–111)
Creatinine, Ser: 1.15 mg/dL (ref 0.61–1.24)
GFR, Estimated: >60 mL/min (ref >60)
Glucose, Bld: 174 mg/dL — ABNORMAL HIGH (ref 70–99)
Potassium: 5.1 mmol/L (ref 3.5–5.1)
Sodium: 137 mmol/L (ref 135–145)
Total Bilirubin: 1.5 mg/dL — ABNORMAL HIGH (ref 0.3–1.2)
Total Protein: 8.4 g/dL — ABNORMAL HIGH (ref 6.5–8.1)

## 2021-12-31 LAB — CBG MONITORING, ED: Glucose-Capillary: 246 mg/dL — ABNORMAL HIGH (ref 70–99)

## 2021-12-31 LAB — LIPASE, BLOOD: Lipase: 80 U/L — ABNORMAL HIGH (ref 11–51)

## 2021-12-31 MED ORDER — SODIUM CHLORIDE (PF) 0.9 % IJ SOLN
INTRAMUSCULAR | Status: AC
  Filled 2021-12-31: qty 50

## 2021-12-31 MED ORDER — INSULIN ASPART 100 UNIT/ML IJ SOLN
0.0000 [IU] | INTRAMUSCULAR | Status: DC
  Administered 2021-12-31: 5 [IU] via SUBCUTANEOUS
  Administered 2022-01-01: 3 [IU] via SUBCUTANEOUS
  Filled 2021-12-31: qty 0.15

## 2021-12-31 MED ORDER — HYDROMORPHONE HCL 1 MG/ML IJ SOLN
0.5000 mg | Freq: Once | INTRAMUSCULAR | Status: AC
  Administered 2021-12-31: 0.5 mg via INTRAVENOUS
  Filled 2021-12-31: qty 1

## 2021-12-31 MED ORDER — ACETAMINOPHEN 650 MG RE SUPP: 650.0000 mg | Freq: Four times a day (QID) | RECTAL | Status: DC | PRN

## 2021-12-31 MED ORDER — PANTOPRAZOLE SODIUM 40 MG IV SOLR
40.0000 mg | Freq: Once | INTRAVENOUS | Status: AC
  Administered 2021-12-31: 40 mg via INTRAVENOUS
  Filled 2021-12-31: qty 10

## 2021-12-31 MED ORDER — ACETAMINOPHEN 325 MG PO TABS
650.0000 mg | ORAL_TABLET | Freq: Four times a day (QID) | ORAL | Status: DC | PRN
  Administered 2021-12-31: 650 mg via ORAL
  Filled 2021-12-31: qty 2

## 2021-12-31 MED ORDER — SODIUM CHLORIDE 0.9 % IV BOLUS
1000.0000 mL | Freq: Once | INTRAVENOUS | Status: AC
  Administered 2021-12-31: 1000 mL via INTRAVENOUS

## 2021-12-31 MED ORDER — IOHEXOL 300 MG/ML  SOLN
100.0000 mL | Freq: Once | INTRAMUSCULAR | Status: AC | PRN
  Administered 2021-12-31: 100 mL via INTRAVENOUS

## 2021-12-31 MED ORDER — SODIUM CHLORIDE 0.9 % IV SOLN: INTRAVENOUS | Status: DC

## 2021-12-31 MED ORDER — ONDANSETRON HCL 4 MG/2ML IJ SOLN
4.0000 mg | Freq: Once | INTRAMUSCULAR | Status: AC
  Administered 2021-12-31: 4 mg via INTRAVENOUS
  Filled 2021-12-31: qty 2

## 2021-12-31 MED ORDER — HYDROMORPHONE HCL 1 MG/ML IJ SOLN
1.0000 mg | INTRAMUSCULAR | Status: AC | PRN
  Administered 2021-12-31 (×2): 1 mg via INTRAVENOUS
  Filled 2021-12-31 (×2): qty 1

## 2021-12-31 MED ORDER — MORPHINE SULFATE (PF) 2 MG/ML IV SOLN
2.0000 mg | INTRAVENOUS | Status: DC | PRN
  Administered 2021-12-31 – 2022-01-01 (×4): 2 mg via INTRAVENOUS
  Filled 2021-12-31 (×4): qty 1

## 2021-12-31 MED ORDER — BISACODYL 5 MG PO TBEC: 5.0000 mg | DELAYED_RELEASE_TABLET | Freq: Every day | ORAL | Status: DC | PRN

## 2021-12-31 MED ORDER — HEPARIN SODIUM (PORCINE) 5000 UNIT/ML IJ SOLN
5000.0000 [IU] | Freq: Three times a day (TID) | INTRAMUSCULAR | Status: DC
  Administered 2021-12-31 – 2022-01-01 (×2): 5000 [IU] via SUBCUTANEOUS
  Filled 2021-12-31 (×2): qty 1

## 2021-12-31 NOTE — ED Triage Notes (Signed)
Pt reports abdominal pain for one month. Pt has been evaluated for same recently and diagnosed with pancreatitis and esophageal varices.  States he was seen at [facility] yesterday and they stated pt may need admission for endoscopy, but they are unable to direct admit. Pt reports episodes of vomiting yesterday.  ?

## 2021-12-31 NOTE — H&P (Signed)
?[facility] ?History and Physical ? ?Spencer Fletcher GYI:[id number] DOB: Aug 13, 1961 DOA: 12/31/2021 ? ?Referring physician:  ?PCP: Sheryle Hail, [place]  ? ?Chief Complaint: Abdominal pain ? ?HPI: Spencer Fletcher is a 61 y.o. WM PMHx  chronic back pain, on Xtampza (oxycodone), DM type II uncontrolled with complication, Hepatic cirrhosis with varices, followed by [facility], CKD stage IIIa, aortic endovascular grafting  ? ?Presents to the emergency department today for evaluation of ongoing abdominal pain.  Symptoms started about a month ago.  Patient reports upper abdominal pain that is severe, worsening with eating or drinking.  Not improved with home medications.  Initially went to [place], [place] emergency department and was told that he had mild pancreatitis, has been on Ozempic. He was sent home on clear liquid diet which he did for a few days but symptoms did not improve.  At one point he had stool cultures done which was positive for C. difficile, although per wife symptoms were not really overly suggestive of C. Diff with only occasional loose stools, not copious malodorous diarrhea.  He completed a course of oral vancomycin.  Patient presented to the emergency department with continued symptoms on 12/28/2021 at [facility].  He had repeat CT imaging at that time which was reassuring.  Patient followed up with 12/30/2021 with [facility].  He was urged to come to the emergency department for admission, possible endoscopy at that time, however waited until today to come in.  He states that the pain is worse in the evening.  He vomited a couple times yesterday.  Recent bowel movements have been nonbloody, poorly formed but not watery. ?  ?Per GI notes, states patient may need endoscopy, paracentesis, pancreatitis treatment. ? ? ? ?Review of Systems:  ?Covid vaccination; ? ?Constitutional:  ?No weight loss, night sweats, Fevers, chills, fatigue.  ?HEENT:  ?No headaches, Difficulty swallowing,Tooth/dental  problems,Sore throat,  ?No sneezing, itching, ear ache, nasal congestion, post nasal drip,  ?Cardio-vascular:  ?No chest pain, Orthopnea, PND, swelling in lower extremities, anasarca, dizziness, palpitations  ?GI:  ?No heartburn, indigestion, abdominal pain, nausea, vomiting, diarrhea, change in bowel habits, loss of appetite  ?Resp:  ?No shortness of breath with exertion or at rest. No excess mucus, no productive cough, No non-productive cough, No coughing up of blood.No change in color of mucus.No wheezing.No chest wall deformity  ?Skin:  ?no rash or lesions.  ?GU:  ?no dysuria, change in color of urine, no urgency or frequency. No flank pain.  ?Musculoskeletal:  ?No joint pain or swelling. No decreased range of motion. No back pain.  ?Psych:  ?No change in mood or affect. No depression or anxiety. No memory loss.  ? ?Past Medical History:  ?Diagnosis Date  ? Anxiety   ? Ascites   ? Chronic pain syndrome   ? CKD (chronic kidney disease), stage III ([place])   ? COPD (chronic obstructive pulmonary disease) ([place])   ? Diabetes mellitus without complication ([place])   ? Essential hypertension   ? GERD (gastroesophageal reflux disease)   ? Heart attack ([place])   ? Hyperlipidemia   ? Insomnia   ? Sleep apnea   ? ?Past Surgical History:  ?Procedure Laterality Date  ? ABDOMINAL AORTIC ENDOVASCULAR STENT GRAFT  2023  ? APPENDECTOMY    ? BACK SURGERY    ? BIOPSY  12/18/2019  ? Procedure: BIOPSY;  Surgeon: Milus Banister, MD;  Location: [facility];  Service: Endoscopy;;  ? CARDIAC CATHETERIZATION    ? CHOLECYSTECTOMY    ?  COLONOSCOPY WITH PROPOFOL N/A 12/18/2019  ? Procedure: COLONOSCOPY WITH PROPOFOL;  Surgeon: Milus Banister, MD;  Location: [facility];  Service: Endoscopy;  Laterality: N/A;  ? ESOPHAGOGASTRODUODENOSCOPY (EGD) WITH PROPOFOL N/A 12/18/2019  ? Procedure: ESOPHAGOGASTRODUODENOSCOPY (EGD) WITH PROPOFOL;  Surgeon: Milus Banister, MD;  Location: [facility];  Service: Endoscopy;  Laterality: N/A;  ? ?Social  History:  reports that he has been smoking cigarettes. He has never used smokeless tobacco. He reports that he does not currently use alcohol. He reports that he does not currently use drugs. ? ?Allergies  ?Allergen Reactions  ? Latex Rash  ? Ambien [Zolpidem] Other (See Comments)  ? Carafate [Sucralfate] Hives and Itching  ? Lexapro [Escitalopram Oxalate] Nausea Only  ? ? ?Family History  ?Problem Relation Age of Onset  ? Breast cancer Mother   ? Diabetes Father   ? Heart disease Father   ? Lung disease Father   ? Heart disease Sister   ? Hypertension Sister   ? Lung disease Sister   ? Heart disease Brother   ?  ? ?Prior to Admission medications   ?Medication Sig Start Date End Date Taking? Authorizing Provider  ?AMOXICILLIN PO Take by mouth. Patient on Amoxicillin for 4 days.    [provider]  ?arformoterol (BROVANA) 15 MCG/2ML NEBU 2 (two) times daily. 09/28/21   [provider]  ?budesonide (PULMICORT) 0.5 MG/2ML nebulizer solution Take 0.5 mg by nebulization daily.    [provider]  ?buPROPion (WELLBUTRIN XL) 150 MG 24 hr tablet Take 150 mg by mouth daily.    [provider]  ?citalopram (CELEXA) 10 MG tablet Take 10 mg by mouth at bedtime.  10/27/19   [provider]  ?clonazePAM (KLONOPIN) 0.5 MG tablet Take 0.5 mg by mouth at bedtime as needed. 10/07/18   [provider]  ?clopidogrel (PLAVIX) 75 MG tablet Take 75 mg by mouth daily.    [provider]  ?Cyanocobalamin (VITAMIN B-12 PO) Take 1 capsule by mouth daily.    [provider]  ?dicyclomine (BENTYL) 20 MG tablet Take 1 tablet (20 mg total) by mouth 2 (two) times daily. 12/28/21   Marcello Fennel, PA-C  ?FERROUS SULFATE PO Take 1 tablet by mouth daily.    [provider]  ?folic acid (FOLVITE) 1 MG tablet Take 1 mg by mouth daily.    [provider]  ?furosemide (LASIX) 20 MG tablet Take 20 mg by mouth as needed.    [provider]  ?hyoscyamine  (LEVSIN SL) 0.125 MG SL tablet Place 1 tablet (0.125 mg total) under the tongue every 6 (six) hours as needed for cramping. 12/30/21   Vladimir Crofts, PA-C  ?insulin glargine, 2 Unit Dial, (TOUJEO MAX SOLOSTAR) 300 UNIT/ML Solostar Pen Inject 80 Units into the skin every morning. And pen needles 2/day ?Patient taking differently: Inject 62 Units into the skin every morning. And pen needles 2/day 01/12/21   Renato Shin, MD  ?insulin lispro (HUMALOG KWIKPEN) 100 UNIT/ML KwikPen Inject 50 Units into the skin daily with breakfast. 01/12/21   Renato Shin, MD  ?naloxone [facility]) 4 MG/0.1ML LIQD nasal spray kit Place 1 spray into the nose as needed (accidental overdose.).  09/23/18   [provider]  ?pantoprazole (PROTONIX) 40 MG tablet Take 40 mg by mouth 2 (two) times daily.    [provider]  ?pregabalin (LYRICA) 200 MG capsule Take 200 mg by mouth in the morning, at noon, and at bedtime.  10/16/18  [provider]  ?propranolol (INDERAL) 10 MG tablet TAKE 1 TABLET BY MOUTH THREE TIMES A DAY ?Patient taking differently: Take 20 mg every morning, 10 mg every evening 08/26/21   Milus Banister, MD  ?rosuvastatin (CRESTOR) 10 MG tablet Take 10 mg by mouth daily.    [provider]  ?Semaglutide (OZEMPIC, 1 MG/DOSE, [place]) Inject 1 Dose into the skin once a week.    [provider]  ?thiamine (VITAMIN B-1) 100 MG tablet Take 100 mg by mouth daily.    [provider]  ?triamcinolone cream (KENALOG) 0.1 % daily as needed. 02/14/21   [provider]  ?XTAMPZA ER 9 MG C12A Take 1 capsule by mouth in the morning, at noon, and at bedtime. 03/28/20   [provider]  ? ? ? ?Consultants:  ?GI Dr. Owens Loffler ? ?Procedures/Significant Events:  ? ? ?I have personally reviewed and interpreted all radiology studies and my findings are as above. ? ? ?VENTILATOR SETTINGS: ? ? ? ?Cultures ?5/13 influenza A/B negative ?5/13 SARS coronavirus  negative ? ?Antimicrobials: ? ? ? ?Devices ?  ? ?LINES / TUBES:  ? ? ? ? ?Continuous Infusions: ? sodium chloride 75 mL/hr at 12/31/21 1740  ? ? ?Physical Exam: ?Vitals:  ? 12/31/21 1022 12/31/21 1518  ?BP: 122/86 (!) 135/98  ?Pulse: 68 69

## 2021-12-31 NOTE — ED Provider Notes (Signed)
?[facility] DEPT ?Provider Note ? ? ?CSN: [id number] ?Arrival date & time: 12/31/21  1004 ? ?  ? ?History ? ?Chief Complaint  ?Patient presents with  ? Abdominal Pain  ? ? ?Spencer Fletcher is a 61 y.o. male. ? ?Patient with history of chronic back pain, on Xtampza, diabetes, hepatic cirrhosis with varices, followed by [facility], CKD, aortic endovascular grafting -- presents to the emergency department today for evaluation of ongoing abdominal pain.  Symptoms started about a month ago.  Patient reports upper abdominal pain that is severe, worsening with eating or drinking.  Not improved with home medications.  Initially went to [place], [place] emergency department and was told that he had mild pancreatitis, has been on Ozempic. He was sent home on clear liquid diet which he did for a few days but symptoms did not improve.  At one point he had stool cultures done which was positive for C. difficile, although per wife symptoms were not really overly suggestive of C. Diff with only occasional loose stools, not copious malodorous diarrhea.  He completed a course of oral vancomycin.  Patient presented to the emergency department with continued symptoms on 12/28/2021 at [facility].  He had repeat CT imaging at that time which was reassuring.  Patient followed up with 12/30/2021 with [facility].  He was urged to come to the emergency department for admission, possible endoscopy at that time, however waited until today to come in.  He states that the pain is worse in the evening.  He vomited a couple times yesterday.  Recent bowel movements have been nonbloody, poorly formed but not watery. ? ?Per GI notes, states patient may need endoscopy, paracentesis, pancreatitis treatment. ? ? ?  ? ?Home Medications ?Prior to Admission medications   ?Medication Sig Start Date End Date Taking? Authorizing Provider  ?AMOXICILLIN PO Take by mouth. Patient on Amoxicillin for 4 days.    [provider]  ?arformoterol (BROVANA) 15 MCG/2ML NEBU 2 (two) times daily. 09/28/21   [provider]  ?budesonide (PULMICORT) 0.5 MG/2ML nebulizer solution Take 0.5 mg by nebulization daily.    [provider]  ?buPROPion (WELLBUTRIN XL) 150 MG 24 hr tablet Take 150 mg by mouth daily.    [provider]  ?citalopram (CELEXA) 10 MG tablet Take 10 mg by mouth at bedtime.  10/27/19   [provider]  ?clonazePAM (KLONOPIN) 0.5 MG tablet Take 0.5 mg by mouth at bedtime as needed. 10/07/18   [provider]  ?clopidogrel (PLAVIX) 75 MG tablet Take 75 mg by mouth daily.    [provider]  ?Cyanocobalamin (VITAMIN B-12 PO) Take 1 capsule by mouth daily.    [provider]  ?dicyclomine (BENTYL) 20 MG tablet Take 1 tablet (20 mg total) by mouth 2 (two) times daily. 12/28/21   Marcello Fennel, PA-C  ?FERROUS SULFATE PO Take 1 tablet by mouth daily.    [provider]  ?folic acid (FOLVITE) 1 MG tablet Take 1 mg by mouth daily.    [provider]  ?furosemide (LASIX) 20 MG tablet Take 20 mg by mouth as needed.    [provider]  ?hyoscyamine (LEVSIN SL) 0.125 MG SL tablet Place 1 tablet (0.125 mg total) under the tongue every 6 (six) hours as needed for cramping. 12/30/21   Vladimir Crofts, PA-C  ?insulin glargine, 2 Unit Dial, (TOUJEO MAX SOLOSTAR) 300 UNIT/ML Solostar Pen Inject 80 Units into the skin every morning. And  pen needles 2/day ?Patient taking differently: Inject 62 Units into the skin every morning. And pen needles 2/day 01/12/21   Renato Shin, MD  ?insulin lispro (HUMALOG KWIKPEN) 100 UNIT/ML KwikPen Inject 50 Units into the skin daily with breakfast. 01/12/21   Renato Shin, MD  ?naloxone [facility]) 4 MG/0.1ML LIQD nasal spray kit Place 1 spray into the nose as needed (accidental overdose.).  09/23/18   [provider]  ?pantoprazole (PROTONIX) 40 MG tablet Take 40 mg by mouth 2 (two) times daily.    [provider]   ?pregabalin (LYRICA) 200 MG capsule Take 200 mg by mouth in the morning, at noon, and at bedtime.  10/16/18   [provider]  ?propranolol (INDERAL) 10 MG tablet TAKE 1 TABLET BY MOUTH THREE TIMES A DAY ?Patient taking differently: Take 20 mg every morning, 10 mg every evening 08/26/21   Milus Banister, MD  ?rosuvastatin (CRESTOR) 10 MG tablet Take 10 mg by mouth daily.    [provider]  ?Semaglutide (OZEMPIC, 1 MG/DOSE, [place]) Inject 1 Dose into the skin once a week.    [provider]  ?thiamine (VITAMIN B-1) 100 MG tablet Take 100 mg by mouth daily.    [provider]  ?triamcinolone cream (KENALOG) 0.1 % daily as needed. 02/14/21   [provider]  ?XTAMPZA ER 9 MG C12A Take 1 capsule by mouth in the morning, at noon, and at bedtime. 03/28/20   [provider]  ?   ? ?Allergies    ?Latex, Ambien [zolpidem], Carafate [sucralfate], and Lexapro [escitalopram oxalate]   ? ?Review of Systems   ?Review of Systems ? ?Physical Exam ?Updated Vital Signs ?BP (!) 135/98 (BP Location: Right Arm)   Pulse 69   Temp 97.7 ?F (36.5 ?C) (Oral)   Resp 14   Ht _0  (1.753 m)   Wt 96.2 kg   SpO2 95%   BMI 31.31 kg/m?  ? ?Physical Exam ?Vitals and nursing note reviewed.  ?Constitutional:   ?   General: He is in acute distress.  ?   Appearance: He is well-developed.  ?   Comments: Patient appears uncomfortable.  ?HENT:  ?   Head: Normocephalic and atraumatic.  ?Eyes:  ?   General:     ?   Right eye: No discharge.     ?   Left eye: No discharge.  ?   Conjunctiva/sclera: Conjunctivae normal.  ?Cardiovascular:  ?   Rate and Rhythm: Normal rate and regular rhythm.  ?   Heart sounds: Normal heart sounds.  ?Pulmonary:  ?   Effort: Pulmonary effort is normal.  ?   Breath sounds: Normal breath sounds.  ?Abdominal:  ?   Palpations: Abdomen is soft.  ?   Tenderness: There is abdominal tenderness in the right upper quadrant, epigastric area, periumbilical area and left upper quadrant.   ?Musculoskeletal:  ?   Cervical back: Normal range of motion and neck supple.  ?Skin: ?   General: Skin is warm and dry.  ?Neurological:  ?   Mental Status: He is alert.  ? ? ?ED Results / Procedures / Treatments   ?Labs ?(all labs ordered are listed, but only abnormal results are displayed) ?Labs Reviewed  ?LIPASE, BLOOD - Abnormal; Notable for the following components:  ?    Result Value  ? Lipase 80 (*)   ? All other components within normal limits  ?COMPREHENSIVE METABOLIC PANEL - Abnormal; Notable for the following components:  ? Glucose, Bld  174 (*)   ? Calcium 10.4 (*)   ? Total Protein 8.4 (*)   ? Total Bilirubin 1.5 (*)   ? All other components within normal limits  ?CBC - Abnormal; Notable for the following components:  ? Platelets 141 (*)   ? All other components within normal limits  ?URINALYSIS, ROUTINE W REFLEX MICROSCOPIC  ? ? ?EKG ?None ? ?Radiology ?No results found. ? ?Procedures ?Procedures  ? ? ?Medications Ordered in ED ?Medications  ?HYDROmorphone (DILAUDID) injection 1 mg (1 mg Intravenous Given 12/31/21 1533)  ?sodium chloride 0.9 % bolus 1,000 mL (1,000 mLs Intravenous New Bag/Given 12/31/21 1538)  ?ondansetron [facility]) injection 4 mg (4 mg Intravenous Given 12/31/21 1533)  ? ? ?ED Course/ Medical Decision Making/ A&P ?  ? ?Patient seen and examined. History obtained directly from patient.  I also reviewed previous emergency department notes, imaging, GI clinic notes. ? ?Labs/EKG: CBC with mild thrombocytopenia, otherwise unremarkable with normal white blood cell count and hemoglobin; CMP normal electrolytes, glucose 174; lipase 80.  ? ?Imaging: None ordered.  Considered CT abdomen/pelvis, however recent imaging performed 3 days ago without new or acute changes in symptoms. ? ?Medications/Fluids: Ordered: IV fluid bolus, IV Dilaudid, IV Zofran. ? ?Most recent vital signs reviewed and are as follows: ?BP (!) 135/98 (BP Location: Right Arm)   Pulse 69   Temp 97.7 ?F (36.5 ?C) (Oral)   Resp 14    Ht _0  (1.753 m)   Wt 96.2 kg   SpO2 95%   BMI 31.31 kg/m?  ? ?Initial impression: Upper abdominal pain with vomiting, possible mild pancreatitis. ? ?Patient discussed with Dr. Gilford Raid.  ? ?I communicated

## 2022-01-01 ENCOUNTER — Encounter (HOSPITAL_COMMUNITY): Payer: Self-pay | Admitting: Internal Medicine

## 2022-01-01 DIAGNOSIS — R1013 Epigastric pain: Secondary | ICD-10-CM

## 2022-01-01 DIAGNOSIS — F419 Anxiety disorder, unspecified: Secondary | ICD-10-CM | POA: Diagnosis not present

## 2022-01-01 DIAGNOSIS — K7031 Alcoholic cirrhosis of liver with ascites: Secondary | ICD-10-CM | POA: Diagnosis not present

## 2022-01-01 DIAGNOSIS — G894 Chronic pain syndrome: Secondary | ICD-10-CM | POA: Diagnosis not present

## 2022-01-01 DIAGNOSIS — E7800 Pure hypercholesterolemia, unspecified: Secondary | ICD-10-CM

## 2022-01-01 LAB — CBC WITH DIFFERENTIAL/PLATELET
Abs Immature Granulocytes: 0.06 10*3/uL (ref 0.00–0.07)
Basophils Absolute: 0.1 10*3/uL (ref 0.0–0.1)
Basophils Relative: 1 %
Eosinophils Absolute: 0.4 10*3/uL (ref 0.0–0.5)
Eosinophils Relative: 6 %
HCT: 43.9 % (ref 39.0–52.0)
Hemoglobin: 14.9 g/dL (ref 13.0–17.0)
Immature Granulocytes: 1 %
Lymphocytes Relative: 27 %
Lymphs Abs: 1.9 10*3/uL (ref 0.7–4.0)
MCH: 29.3 pg (ref 26.0–34.0)
MCHC: 33.9 g/dL (ref 30.0–36.0)
MCV: 86.4 fL (ref 80.0–100.0)
Monocytes Absolute: 0.5 10*3/uL (ref 0.1–1.0)
Monocytes Relative: 8 %
Neutro Abs: 4.1 10*3/uL (ref 1.7–7.7)
Neutrophils Relative %: 57 %
Platelets: 104 10*3/uL — ABNORMAL LOW (ref 150–400)
RBC: 5.08 MIL/uL (ref 4.22–5.81)
RDW: 13.5 % (ref 11.5–15.5)
WBC: 7 10*3/uL (ref 4.0–10.5)
nRBC: 0 % (ref 0.0–0.2)

## 2022-01-01 LAB — COMPREHENSIVE METABOLIC PANEL
ALT: 33 U/L (ref 0–44)
AST: 28 U/L (ref 15–41)
Albumin: 3.8 g/dL (ref 3.5–5.0)
Alkaline Phosphatase: 58 U/L (ref 38–126)
Anion gap: 9 (ref 5–15)
BUN: 15 mg/dL (ref 8–23)
CO2: 23 mmol/L (ref 22–32)
Calcium: 9.4 mg/dL (ref 8.9–10.3)
Chloride: 108 mmol/L (ref 98–111)
Creatinine, Ser: 0.91 mg/dL (ref 0.61–1.24)
GFR, Estimated: >60 mL/min (ref >60)
Glucose, Bld: 96 mg/dL (ref 70–99)
Potassium: 4.1 mmol/L (ref 3.5–5.1)
Sodium: 140 mmol/L (ref 135–145)
Total Bilirubin: 0.6 mg/dL (ref 0.3–1.2)
Total Protein: 6.8 g/dL (ref 6.5–8.1)

## 2022-01-01 LAB — MAGNESIUM: Magnesium: 2 mg/dL (ref 1.7–2.4)

## 2022-01-01 LAB — GLUCOSE, CAPILLARY
Glucose-Capillary: 192 mg/dL — ABNORMAL HIGH (ref 70–99)
Glucose-Capillary: 130 mg/dL — ABNORMAL HIGH (ref 70–99)
Glucose-Capillary: 107 mg/dL — ABNORMAL HIGH (ref 70–99)

## 2022-01-01 LAB — PHOSPHORUS: Phosphorus: 4.9 mg/dL — ABNORMAL HIGH (ref 2.5–4.6)

## 2022-01-01 LAB — HIV ANTIBODY (ROUTINE TESTING W REFLEX): HIV Screen 4th Generation wRfx: NONREACTIVE

## 2022-01-01 LAB — LIPASE, BLOOD: Lipase: 53 U/L — ABNORMAL HIGH (ref 11–51)

## 2022-01-01 MED ORDER — INSULIN LISPRO (1 UNIT DIAL) 100 UNIT/ML (KWIKPEN): 50.0000 [IU] | PEN_INJECTOR | Freq: Every day | SUBCUTANEOUS | 3 refills | Status: AC

## 2022-01-01 NOTE — Consult Note (Addendum)
[facility] ?Referring Provider: Dr. Sherral Hammers ?Primary Care Physician:  Sheryle Hail, FNP ?Primary Gastroenterologist:  Dr. Ardis Hughs ? ?Reason for Consultation: ?Abdominal pains ? ? ?HPI:  ?Spencer Fletcher is a 61 y.o. male who has had abdominal discomfort, bowel irregularity starting about 1 month ago.  1 week prior to this he was put on Ozempic.  The abdominal discomfort is lower abdomen and sometimes upper abdomen.  It can happen after eating, it can happen without eating, sometimes moving his bowels helps.  He has had some loose stools including some very dark stools. ? ?He stopped the Ozempic and most of the symptoms improved.  He rechallenged with a dose of Ozempic about 1 week ago and the symptoms returned. ? ?He has had no hematemesis, no hematochezia. ? ?Since his last office visit 3 months ago when I saw me he had several of his teeth pulled and so he has been able to completely avoid NSAIDs.  He takes Tylenol only. ? ? ?Review of pertinent gastrointestinal problems: ?1.  Routine risk for colon cancer.  Colonoscopy April 2021 found no polyps or cancers.  He did have prominent rectal vessels which may be portal hypertensive related. ?2.  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  Prolonged hospital stay including several weeks in [place] facility while intubated.  Temporary G tube feeding.  Presented to [facility] to establish care for 5 days after [facility] discharge. ?MELD-Na 11 (03/2021 labs) ?Liver imaging: MRI April 2021 showed cirrhosis, portal hypertension, no focal tumors in liver.  Minimal ascites.  [place] April 2022 cirrhosis without focal liver lesion. ?Alpha-fetoprotein 04/2021 was normal ?EGD April 2021 found large distal esophagus varices, possible proximal gastric varices, portal gastropathy.  Nonspecific gastritis which was H. pylori negative  on biopsy.  Propranolol 54m tid started ?No alcohol intake since January 2021 ?Vaccinations: Immune to hepatitis B as of June 2021. ? ? ?Past Medical History:  ?Diagnosis Date  ? Anxiety   ? Ascites   ? Chronic pain syndrome   ? CKD (chronic kidney disease), stage III ([place]   ? COPD (chronic obstructive pulmonary disease) ([place]   ? Diabetes mellitus without complication ([place]   ? Essential hypertension   ? GERD (gastroesophageal reflux disease)   ? Heart attack ([place]   ? Hyperlipidemia   ? Insomnia   ? Sleep apnea   ? ? ?Past Surgical History:  ?Procedure Laterality Date  ? ABDOMINAL AORTIC ENDOVASCULAR STENT GRAFT  2023  ? APPENDECTOMY    ? BACK SURGERY    ? BIOPSY  12/18/2019  ? Procedure: BIOPSY;  Surgeon: JMilus Banister MD;  [facility];  Service: Endoscopy;;  ? CARDIAC CATHETERIZATION    ? CHOLECYSTECTOMY    ? COLONOSCOPY WITH PROPOFOL N/A 12/18/2019  ? Procedure: COLONOSCOPY WITH PROPOFOL;  Surgeon: JMilus Banister MD;  [facility]: [facility];  Service: Endoscopy;  Laterality: N/A;  ? ESOPHAGOGASTRODUODENOSCOPY (EGD) WITH PROPOFOL N/A 12/18/2019  ? Procedure: ESOPHAGOGASTRODUODENOSCOPY (EGD) WITH PROPOFOL;  Surgeon: JMilus Banister MD;  Location: [facility];  Service: Endoscopy;  Laterality: N/A;  ? ? ?Prior to Admission medications   ?Medication Sig Start Date End Date Taking? Authorizing Provider  ?budesonide (PULMICORT) 0.5 MG/2ML nebulizer solution Take 0.5 mg by nebulization daily as needed (sob/wheezing).   Yes [provider]  ?buPROPion (WELLBUTRIN XL) 150 MG 24 hr tablet Take 150  mg by mouth at bedtime.   Yes [provider]  ?cetirizine (ZYRTEC) 10 MG tablet Take 10 mg by mouth daily. 12/23/21  Yes [provider]  ?chlorhexidine (PERIDEX) 0.12 % solution Use as directed 15 mLs in the mouth or throat daily. 12/12/21  Yes [provider]  ?citalopram (CELEXA) 10 MG tablet Take 10 mg by mouth at bedtime.  10/27/19  Yes [provider]   ?clonazePAM (KLONOPIN) 0.5 MG tablet Take 0.5 mg by mouth 2 (two) times daily. 10/07/18  Yes [provider]  ?clopidogrel (PLAVIX) 75 MG tablet Take 75 mg by mouth daily.   Yes [provider]  ?dicyclomine (BENTYL) 20 MG tablet Take 1 tablet (20 mg total) by mouth 2 (two) times daily. 12/28/21  Yes Marcello Fennel, PA-C  ?FERROUS SULFATE PO Take 1 tablet by mouth at bedtime.   Yes [provider]  ?folic acid (FOLVITE) 1 MG tablet Take 1 mg by mouth daily.   Yes [provider]  ?furosemide (LASIX) 20 MG tablet Take 20 mg by mouth daily as needed for fluid.   Yes [provider]  ?Glucagon, rDNA, (GLUCAGON EMERGENCY) 1 MG KIT Inject 1 mL into the muscle daily as needed (low blood sugar). 10/04/21  Yes [provider]  ?insulin glargine, 2 Unit Dial, (TOUJEO MAX SOLOSTAR) 300 UNIT/ML Solostar Pen Inject 80 Units into the skin every morning. And pen needles 2/day ?Patient taking differently: Inject 38 Units into the skin every morning. And pen needles 2/day 01/12/21  Yes Renato Shin, MD  ?insulin lispro (HUMALOG KWIKPEN) 100 UNIT/ML KwikPen Inject 50 Units into the skin daily with breakfast. ?Patient taking differently: Inject 0-20 Units into the skin as directed. Sliding Scale 01/12/21  Yes Renato Shin, MD  ?metoCLOPramide (REGLAN) 10 MG tablet Take 10 mg by mouth in the morning, at noon, and at bedtime.   Yes [provider]  ?OZEMPIC, 1 MG/DOSE, 4 MG/3ML SOPN Inject 1 mg into the skin once a week. 11/30/21  Yes [provider]  ?pantoprazole (PROTONIX) 40 MG tablet Take 40 mg by mouth 2 (two) times daily.   Yes [provider]  ?pregabalin (LYRICA) 200 MG capsule Take 200 mg by mouth in the morning, at noon, and at bedtime.  10/16/18  Yes [provider]  ?propranolol (INDERAL) 10 MG tablet TAKE 1 TABLET BY MOUTH THREE TIMES A DAY ?Patient taking differently: Take 10-20 mg by mouth as directed. Take 20 mg every morning, 10  mg every evening 08/26/21  Yes Milus Banister, MD  ?rosuvastatin (CRESTOR) 40 MG tablet Take 40 mg by mouth at bedtime. 11/10/21  Yes [provider]  ?triamcinolone cream (KENALOG) 0.1 % 1 application. daily as needed (rash). 02/14/21  Yes [provider]  ?vancomycin (VANCOCIN) 125 MG capsule Take 125 mg by mouth 4 (four) times daily. 12/23/21  Yes [provider]  ?XTAMPZA ER 9 MG C12A Take 1 capsule by mouth in the morning, at noon, and at bedtime. 03/28/20  Yes [provider]  ?hyoscyamine (LEVSIN SL) 0.125 MG SL tablet Place 1 tablet (0.125 mg total) under the tongue every 6 (six) hours as needed for cramping. 12/30/21   Vladimir Crofts, PA-C  ?naloxone [facility]) 4 MG/0.1ML LIQD nasal spray kit Place 1 spray into the nose as needed (accidental overdose.).  09/23/18   [provider]  ? ? ?Current Facility-Administered Medications  ?Medication Dose Route Frequency Provider Last Rate Last Admin  ? 0.9 %  sodium chloride infusion   Intravenous Continuous Allie Bossier, MD 75 mL/hr at 12/31/21 2205 New Bag at 12/31/21 2205  ? acetaminophen (TYLENOL) tablet 650 mg  650 mg Oral Q6H PRN Allie Bossier, MD   650 mg at 12/31/21 9290  ? Or  ? acetaminophen (TYLENOL) suppository 650 mg  650 mg Rectal Q6H PRN Allie Bossier, MD      ? bisacodyl (DULCOLAX) EC tablet 5 mg  5 mg Oral Daily PRN Allie Bossier, MD      ? heparin injection 5,000 Units  5,000 Units Subcutaneous Q8H Allie Bossier, MD   5,000 Units at 01/01/22 9030  ? insulin aspart (novoLOG) injection 0-15 Units  0-15 Units Subcutaneous Q4H Allie Bossier, MD   3 Units at 01/01/22 0222  ? morphine (PF) 2 MG/ML injection 2 mg  2 mg Intravenous Q2H PRN Allie Bossier, MD   2 mg at 01/01/22 1499  ? ? ?Allergies as of 12/31/2021 - Review Complete 12/31/2021  ?Allergen Reaction Noted  ? Latex Rash 12/30/2021  ? Ambien [zolpidem] Other (See Comments) 12/03/2019  ? Carafate [sucralfate] Hives and Itching 12/03/2019  ?  Lexapro [escitalopram oxalate] Nausea Only 03/10/2020  ? ? ?Family History  ?Problem Relation Age of Onset  ? Breast cancer Mother   ? Diabetes Father   ? Heart disease Father   ? Lung disease Father   ? Heart

## 2022-01-01 NOTE — Discharge Summary (Signed)
Physician Discharge Summary  ?LYNFORD ESPINOZA INO:[id number] DOB: 1961/03/12 DOA: 12/31/2021 ? ?PCP: Sheryle Hail, [place] ? ?Admit date: 12/31/2021 ?Discharge date: 01/02/2022 ? ?Time spent: 30 minutes ? ?Recommendations for Outpatient Follow-up:  ? ?Diarrhea with dark stools ?-Per GI note appears they are going to perform colonoscopy.  ADDENDUM GI felt colonoscopy not warranted so canceled ?  ?Routine risk for colon cancer.   ?-Colonoscopy April 2021 found no polyps or cancers.  He did have prominent rectal vessels which may be portal hypertensive related. ? 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  Prolonged hospital stay including several weeks in [place] facility while intubated.  Temporary G tube feeding.  Presented to [facility] to establish care for 5 days after [facility] discharge. ?MELD-Na 11 (03/2021 labs) ?Liver imaging: MRI April 2021 showed cirrhosis, portal hypertension, no focal tumors in liver.  Minimal ascites.  [place] April 2022 cirrhosis without focal liver lesion. ?Alpha-fetoprotein 04/2021 was normal ?EGD April 2021 found large distal esophagus varices, possible proximal gastric varices, portal gastropathy.  Nonspecific gastritis which was H. pylori negative on biopsy.  Propranolol 48m tid started ?No alcohol intake since January 2021 ?Vaccinations: Immune to hepatitis B as of June 2021. ?  ?Pancreatitis ?-I do not think he needs any further testing.   ?-completely avoid NSAIDs.  ?- low salt diet.   ?-never resume Ozempic. ?-Dr. [facility] office will reach out to him for follow-up in the next several weeks. ? ?  ?DM type II uncontrolled with hyperglycemia ?-Restart Hold home medication ?  ?HLD ?-Crestor 40 mg daily ? ?CKD stage III ?Lab Results  ?Component Value Date  ? CREATININE 0.91 01/01/2022  ? CREATININE 1.08 12/31/2021  ? CREATININE 1.15  12/31/2021  ? CREATININE 1.22 12/30/2021  ? CREATININE 1.07 12/28/2021  ?-Resolved ? ?  ?OSA ?- CPAP per respiratory ?  ?Acute on Chronic pain syndrome ?-Morphine IV 2 mg q 2 hrs PRN ?-Resume home medication regimen ?  ?Anxiety ?-Resume home medication ?  ?Obesity (BMI 31.31 kg/m?.) ?- Address with PCP ? ?Discharge Diagnoses:  ?Principal Problem: ?  Acute pancreatitis ?Active Problems: ?  Pancreatitis ?  Uncontrolled type 2 diabetes mellitus with hyperglycemia ([place] ?  Hepatic cirrhosis ([place] ?  CKD (chronic kidney disease), stage III ([place] ?  COPD mixed type ([place] ?  HLD (hyperlipidemia) ?  Chronic pain syndrome ?  OSA (obstructive sleep apnea) ?  Anxiety ?  Obesity (BMI 30-39.9) ?  Alcoholic cirrhosis of liver with ascites ([place]St. [place] ? ? ?Discharge Condition: Stable ? ?Diet recommendation: Heart healthy/low-salt ? ?Filed Weights  ? 12/31/21 1022  ?Weight: 96.2 kg  ? ? ?History of present illness:  ? 61y.o. WM PMHx  chronic back pain, on Xtampza (oxycodone), DM type II uncontrolled with complication, Hepatic cirrhosis with varices, followed by [facility], CKD stage IIIa, aortic endovascular grafting  ?  ?Presents to the emergency department today for evaluation of ongoing abdominal pain.  Symptoms started about a month ago.  Patient reports upper abdominal pain that is severe, worsening with eating or drinking.  Not improved with home medications.  [facility] and was told that he had mild pancreatitis, has been on Ozempic. He was sent home on clear liquid diet which he did for a few days but symptoms did not improve.  At one point he  had stool cultures done which was positive for C. difficile, although per wife symptoms were not really overly suggestive of C. Diff with only occasional loose stools, not copious malodorous diarrhea.  He completed a course of oral vancomycin.  Patient presented to the emergency department with continued symptoms on 12/28/2021 at [facility].   He had repeat CT imaging at that time which was reassuring.  Patient followed up with 12/30/2021 with [facility].  He was urged to come to the emergency department for admission, possible endoscopy at that time, however waited until today to come in.  He states that the pain is worse in the evening.  He vomited a couple times yesterday.  Recent bowel movements have been nonbloody, poorly formed but not watery. ?  ?Per GI notes, states patient may need endoscopy, paracentesis, pancreatitis treatment. ? ?Hospital Course:  ?See above ? ? ? ?Discharge Exam: ?Vitals:  ? 12/31/21 2045 12/31/21 2144 01/01/22 0200 01/01/22 0618  ?BP: 123/87 140/86 125/79 118/81  ?Pulse: 73 66 67 67  ?Resp: 17 18 18 18   ?Temp: 97.6 ?F (36.4 ?C) 98.4 ?F (36.9 ?C) 97.7 ?F (36.5 ?C) (!) 97.4 ?F (36.3 ?C)  ?TempSrc: Oral Oral Oral Oral  ?SpO2: 92% 93% 96% 95%  ?Weight:      ?Height:      ? ? ?General: A/O x4, No acute respiratory distress ?Eyes: negative scleral hemorrhage, negative anisocoria, negative icterus ?ENT: Negative Runny nose, negative gingival bleeding, ?Neck:  Negative scars, masses, torticollis, lymphadenopathy, JVD ?Lungs: Clear to auscultation bilaterally without wheezes or crackles ?Cardiovascular: Regular rate and rhythm without murmur gallop or rub normal S1 and S2 ? ?Discharge Instructions ? ? ?Allergies as of 01/01/2022   ? ?   Reactions  ? Latex Rash  ? Ambien [zolpidem] Other (See Comments)  ? Carafate [sucralfate] Hives, Itching  ? Lexapro [escitalopram Oxalate] Nausea Only  ? ?  ? ?  ?Medication List  ?  ? ?STOP taking these medications   ? ?chlorhexidine 0.12 % solution ?Commonly known as: PERIDEX ?  ?Ozempic (1 MG/DOSE) 4 MG/3ML Sopn ?Generic drug: Semaglutide (1 MG/DOSE) ?  ? ?  ? ?TAKE these medications   ? ?budesonide 0.5 MG/2ML nebulizer solution ?Commonly known as: PULMICORT ?Take 0.5 mg by nebulization daily as needed (sob/wheezing). ?  ?buPROPion 150 MG 24 hr tablet ?Commonly known as: WELLBUTRIN XL ?Take 150 mg  by mouth at bedtime. ?  ?cetirizine 10 MG tablet ?Commonly known as: ZYRTEC ?Take 10 mg by mouth daily. ?  ?citalopram 10 MG tablet ?Commonly known as: CELEXA ?Take 10 mg by mouth at bedtime. ?  ?clonazePAM 0.5 MG tablet ?Commonly known as: KLONOPIN ?Take 0.5 mg by mouth 2 (two) times daily. ?  ?clopidogrel 75 MG tablet ?Commonly known as: PLAVIX ?Take 75 mg by mouth daily. ?  ?dicyclomine 20 MG tablet ?Commonly known as: BENTYL ?Take 1 tablet (20 mg total) by mouth 2 (two) times daily. ?  ?FERROUS SULFATE PO ?Take 1 tablet by mouth at bedtime. ?  ?folic acid 1 MG tablet ?Commonly known as: FOLVITE ?Take 1 mg by mouth daily. ?  ?furosemide 20 MG tablet ?Commonly known as: LASIX ?Take 20 mg by mouth daily as needed for fluid. ?  ?Glucagon Emergency 1 MG Kit ?Inject 1 mL into the muscle daily as needed (low blood sugar). ?  ?hyoscyamine 0.125 MG SL tablet ?Commonly known as: LEVSIN SL ?Place 1 tablet (0.125 mg total) under the tongue every 6 (six) hours as needed for  cramping. ?  ?insulin lispro 100 UNIT/ML KwikPen ?Commonly known as: HumaLOG KwikPen ?Inject 50 Units into the skin daily with breakfast. ?What changed:  ?how much to take ?when to take this ?additional instructions ?  ?metoCLOPramide 10 MG tablet ?Commonly known as: REGLAN ?Take 10 mg by mouth in the morning, at noon, and at bedtime. ?  ?Narcan 4 MG/0.1ML Liqd nasal spray kit ?Generic drug: naloxone ?Place 1 spray into the nose as needed (accidental overdose.). ?  ?pantoprazole 40 MG tablet ?Commonly known as: PROTONIX ?Take 40 mg by mouth 2 (two) times daily. ?  ?pregabalin 200 MG capsule ?Commonly known as: LYRICA ?Take 200 mg by mouth in the morning, at noon, and at bedtime. ?  ?propranolol 10 MG tablet ?Commonly known as: INDERAL ?TAKE 1 TABLET BY MOUTH THREE TIMES A DAY ?What changed:  ?how much to take ?when to take this ?additional instructions ?  ?rosuvastatin 40 MG tablet ?Commonly known as: CRESTOR ?Take 40 mg by mouth at bedtime. ?  ?Toujeo  Max SoloStar 300 UNIT/ML Solostar Pen ?Generic drug: insulin glargine (2 Unit Dial) ?Inject 80 Units into the skin every morning. And pen needles 2/day ?What changed: how much to take ?  ?triamcinolone cream

## 2022-01-01 NOTE — Progress Notes (Signed)
Discharged home per order. Discharge instructions reviewed with pt, voiced understanding. Copy of instructions given to pt. IV d/c. All personal belongings sent with pt. ?

## 2022-01-02 LAB — AFP TUMOR MARKER: AFP-Tumor Marker: 2.9 ng/mL (ref <6.1)

## 2022-01-18 ENCOUNTER — Telehealth: Payer: Self-pay | Admitting: Physician Assistant

## 2022-01-18 NOTE — Telephone Encounter (Signed)
Patient called requesting to speak with a nurse regarding Lasik medication.

## 2022-01-18 NOTE — Telephone Encounter (Signed)
I spoke to [facility] at Dr Shanda Bumps office and she said Wonda Horner, FNP last week with them was last week. Candice said they will get Dr Nelson Chimes to refill this for him. I told her the pharmacy and I called Ryun and told him.

## 2022-01-18 NOTE — Telephone Encounter (Signed)
Spencer Fletcher is requesting Dr Christella Hartigan to refill his generic lasix 20mg  that he uses daily as needed. His PCP , FNP is on vacation this week and next. The whole office is closed. His feet are swollen and he said Dr Wonda Horner has refilled the lasix in the past and he didn't know who else to ask. Please advise Sir.

## 2022-01-19 DIAGNOSIS — R296 Repeated falls: Secondary | ICD-10-CM | POA: Diagnosis not present

## 2022-01-19 DIAGNOSIS — E1140 Type 2 diabetes mellitus with diabetic neuropathy, unspecified: Secondary | ICD-10-CM | POA: Diagnosis not present

## 2022-01-19 DIAGNOSIS — Z794 Long term (current) use of insulin: Secondary | ICD-10-CM | POA: Diagnosis not present

## 2022-01-19 DIAGNOSIS — G4700 Insomnia, unspecified: Secondary | ICD-10-CM | POA: Diagnosis not present

## 2022-01-23 DIAGNOSIS — J9690 Respiratory failure, unspecified, unspecified whether with hypoxia or hypercapnia: Secondary | ICD-10-CM | POA: Diagnosis not present

## 2022-01-23 DIAGNOSIS — F338 Other recurrent depressive disorders: Secondary | ICD-10-CM | POA: Diagnosis not present

## 2022-01-23 DIAGNOSIS — Z79899 Other long term (current) drug therapy: Secondary | ICD-10-CM | POA: Diagnosis not present

## 2022-01-23 DIAGNOSIS — F1120 Opioid dependence, uncomplicated: Secondary | ICD-10-CM | POA: Diagnosis not present

## 2022-01-23 DIAGNOSIS — E119 Type 2 diabetes mellitus without complications: Secondary | ICD-10-CM | POA: Diagnosis not present

## 2022-01-23 DIAGNOSIS — M545 Low back pain, unspecified: Secondary | ICD-10-CM | POA: Diagnosis not present

## 2022-01-23 DIAGNOSIS — G894 Chronic pain syndrome: Secondary | ICD-10-CM | POA: Diagnosis not present

## 2022-01-23 DIAGNOSIS — M961 Postlaminectomy syndrome, not elsewhere classified: Secondary | ICD-10-CM | POA: Diagnosis not present

## 2022-01-23 DIAGNOSIS — F419 Anxiety disorder, unspecified: Secondary | ICD-10-CM | POA: Diagnosis not present

## 2022-01-23 DIAGNOSIS — I10 Essential (primary) hypertension: Secondary | ICD-10-CM | POA: Diagnosis not present

## 2022-01-27 DIAGNOSIS — J449 Chronic obstructive pulmonary disease, unspecified: Secondary | ICD-10-CM | POA: Diagnosis not present

## 2022-02-11 DIAGNOSIS — E1165 Type 2 diabetes mellitus with hyperglycemia: Secondary | ICD-10-CM | POA: Diagnosis not present

## 2022-02-13 ENCOUNTER — Ambulatory Visit: Payer: Medicare PPO | Admitting: Gastroenterology

## 2022-02-23 DIAGNOSIS — E1142 Type 2 diabetes mellitus with diabetic polyneuropathy: Secondary | ICD-10-CM | POA: Diagnosis not present

## 2022-02-23 DIAGNOSIS — R1013 Epigastric pain: Secondary | ICD-10-CM | POA: Diagnosis not present

## 2022-02-23 DIAGNOSIS — N521 Erectile dysfunction due to diseases classified elsewhere: Secondary | ICD-10-CM | POA: Diagnosis not present

## 2022-02-23 DIAGNOSIS — F419 Anxiety disorder, unspecified: Secondary | ICD-10-CM | POA: Diagnosis not present

## 2022-02-26 DIAGNOSIS — J449 Chronic obstructive pulmonary disease, unspecified: Secondary | ICD-10-CM | POA: Diagnosis not present

## 2022-03-09 DIAGNOSIS — I10 Essential (primary) hypertension: Secondary | ICD-10-CM | POA: Diagnosis not present

## 2022-03-09 DIAGNOSIS — R079 Chest pain, unspecified: Secondary | ICD-10-CM | POA: Diagnosis not present

## 2022-03-09 DIAGNOSIS — E785 Hyperlipidemia, unspecified: Secondary | ICD-10-CM | POA: Diagnosis not present

## 2022-03-09 DIAGNOSIS — K219 Gastro-esophageal reflux disease without esophagitis: Secondary | ICD-10-CM | POA: Diagnosis not present

## 2022-03-09 DIAGNOSIS — R4020 Unspecified coma: Secondary | ICD-10-CM | POA: Diagnosis not present

## 2022-03-09 DIAGNOSIS — Z91148 Patient's other noncompliance with medication regimen for other reason: Secondary | ICD-10-CM | POA: Diagnosis not present

## 2022-03-09 DIAGNOSIS — R064 Hyperventilation: Secondary | ICD-10-CM | POA: Diagnosis not present

## 2022-03-09 DIAGNOSIS — Z5320 Procedure and treatment not carried out because of patient's decision for unspecified reasons: Secondary | ICD-10-CM | POA: Diagnosis not present

## 2022-03-09 DIAGNOSIS — K7460 Unspecified cirrhosis of liver: Secondary | ICD-10-CM | POA: Diagnosis not present

## 2022-03-09 DIAGNOSIS — R0789 Other chest pain: Secondary | ICD-10-CM | POA: Diagnosis not present

## 2022-03-09 DIAGNOSIS — F10929 Alcohol use, unspecified with intoxication, unspecified: Secondary | ICD-10-CM | POA: Diagnosis not present

## 2022-03-09 DIAGNOSIS — R4182 Altered mental status, unspecified: Secondary | ICD-10-CM | POA: Diagnosis not present

## 2022-03-09 DIAGNOSIS — E119 Type 2 diabetes mellitus without complications: Secondary | ICD-10-CM | POA: Diagnosis not present

## 2022-03-09 DIAGNOSIS — J449 Chronic obstructive pulmonary disease, unspecified: Secondary | ICD-10-CM | POA: Diagnosis not present

## 2022-03-09 DIAGNOSIS — R55 Syncope and collapse: Secondary | ICD-10-CM | POA: Diagnosis not present

## 2022-03-11 DIAGNOSIS — J320 Chronic maxillary sinusitis: Secondary | ICD-10-CM | POA: Diagnosis not present

## 2022-03-11 DIAGNOSIS — H5789 Other specified disorders of eye and adnexa: Secondary | ICD-10-CM | POA: Diagnosis not present

## 2022-03-11 DIAGNOSIS — H579 Unspecified disorder of eye and adnexa: Secondary | ICD-10-CM | POA: Diagnosis not present

## 2022-03-17 ENCOUNTER — Emergency Department (HOSPITAL_COMMUNITY): Payer: Medicare PPO

## 2022-03-17 ENCOUNTER — Other Ambulatory Visit: Payer: Self-pay

## 2022-03-17 ENCOUNTER — Encounter (HOSPITAL_COMMUNITY): Payer: Self-pay

## 2022-03-17 ENCOUNTER — Observation Stay (HOSPITAL_COMMUNITY)
Admission: EM | Admit: 2022-03-17 | Discharge: 2022-03-18 | Discharge status: Against Medical Advice | Payer: Medicare PPO | Attending: Internal Medicine | Admitting: Internal Medicine

## 2022-03-17 ENCOUNTER — Ambulatory Visit: Payer: Medicare PPO | Admitting: Gastroenterology

## 2022-03-17 DIAGNOSIS — K766 Portal hypertension: Secondary | ICD-10-CM | POA: Diagnosis not present

## 2022-03-17 DIAGNOSIS — J449 Chronic obstructive pulmonary disease, unspecified: Secondary | ICD-10-CM | POA: Diagnosis present

## 2022-03-17 DIAGNOSIS — R4182 Altered mental status, unspecified: Secondary | ICD-10-CM | POA: Diagnosis not present

## 2022-03-17 DIAGNOSIS — E1165 Type 2 diabetes mellitus with hyperglycemia: Secondary | ICD-10-CM | POA: Diagnosis not present

## 2022-03-17 DIAGNOSIS — F1010 Alcohol abuse, uncomplicated: Secondary | ICD-10-CM

## 2022-03-17 DIAGNOSIS — E1122 Type 2 diabetes mellitus with diabetic chronic kidney disease: Secondary | ICD-10-CM | POA: Insufficient documentation

## 2022-03-17 DIAGNOSIS — Z91040 Latex allergy status: Secondary | ICD-10-CM | POA: Insufficient documentation

## 2022-03-17 DIAGNOSIS — F1120 Opioid dependence, uncomplicated: Secondary | ICD-10-CM

## 2022-03-17 DIAGNOSIS — I10 Essential (primary) hypertension: Secondary | ICD-10-CM | POA: Diagnosis not present

## 2022-03-17 DIAGNOSIS — F17210 Nicotine dependence, cigarettes, uncomplicated: Secondary | ICD-10-CM | POA: Insufficient documentation

## 2022-03-17 DIAGNOSIS — G9341 Metabolic encephalopathy: Secondary | ICD-10-CM | POA: Diagnosis present

## 2022-03-17 DIAGNOSIS — N182 Chronic kidney disease, stage 2 (mild): Secondary | ICD-10-CM | POA: Insufficient documentation

## 2022-03-17 DIAGNOSIS — R197 Diarrhea, unspecified: Secondary | ICD-10-CM | POA: Diagnosis not present

## 2022-03-17 DIAGNOSIS — E876 Hypokalemia: Secondary | ICD-10-CM

## 2022-03-17 DIAGNOSIS — I129 Hypertensive chronic kidney disease with stage 1 through stage 4 chronic kidney disease, or unspecified chronic kidney disease: Secondary | ICD-10-CM | POA: Diagnosis not present

## 2022-03-17 DIAGNOSIS — K7030 Alcoholic cirrhosis of liver without ascites: Secondary | ICD-10-CM | POA: Diagnosis not present

## 2022-03-17 DIAGNOSIS — G9340 Encephalopathy, unspecified: Secondary | ICD-10-CM

## 2022-03-17 DIAGNOSIS — R296 Repeated falls: Secondary | ICD-10-CM | POA: Diagnosis not present

## 2022-03-17 DIAGNOSIS — E785 Hyperlipidemia, unspecified: Secondary | ICD-10-CM | POA: Diagnosis present

## 2022-03-17 DIAGNOSIS — K7460 Unspecified cirrhosis of liver: Secondary | ICD-10-CM | POA: Diagnosis present

## 2022-03-17 DIAGNOSIS — Z7902 Long term (current) use of antithrombotics/antiplatelets: Secondary | ICD-10-CM | POA: Insufficient documentation

## 2022-03-17 DIAGNOSIS — Z95828 Presence of other vascular implants and grafts: Secondary | ICD-10-CM | POA: Insufficient documentation

## 2022-03-17 DIAGNOSIS — R42 Dizziness and giddiness: Secondary | ICD-10-CM | POA: Diagnosis not present

## 2022-03-17 DIAGNOSIS — E669 Obesity, unspecified: Secondary | ICD-10-CM | POA: Diagnosis present

## 2022-03-17 DIAGNOSIS — Z794 Long term (current) use of insulin: Secondary | ICD-10-CM | POA: Diagnosis not present

## 2022-03-17 DIAGNOSIS — J9811 Atelectasis: Secondary | ICD-10-CM | POA: Diagnosis not present

## 2022-03-17 DIAGNOSIS — Z720 Tobacco use: Secondary | ICD-10-CM | POA: Diagnosis not present

## 2022-03-17 DIAGNOSIS — R739 Hyperglycemia, unspecified: Secondary | ICD-10-CM | POA: Diagnosis not present

## 2022-03-17 DIAGNOSIS — K5730 Diverticulosis of large intestine without perforation or abscess without bleeding: Secondary | ICD-10-CM | POA: Insufficient documentation

## 2022-03-17 DIAGNOSIS — I700 Atherosclerosis of aorta: Secondary | ICD-10-CM | POA: Diagnosis not present

## 2022-03-17 DIAGNOSIS — S3991XA Unspecified injury of abdomen, initial encounter: Secondary | ICD-10-CM | POA: Diagnosis not present

## 2022-03-17 DIAGNOSIS — Z79899 Other long term (current) drug therapy: Secondary | ICD-10-CM | POA: Diagnosis not present

## 2022-03-17 DIAGNOSIS — K7469 Other cirrhosis of liver: Secondary | ICD-10-CM | POA: Diagnosis not present

## 2022-03-17 LAB — CBC WITH DIFFERENTIAL/PLATELET
Abs Immature Granulocytes: 0.04 10*3/uL (ref 0.00–0.07)
Basophils Absolute: 0 10*3/uL (ref 0.0–0.1)
Basophils Relative: 1 %
Eosinophils Absolute: 0.4 10*3/uL (ref 0.0–0.5)
Eosinophils Relative: 4 %
HCT: 47.3 % (ref 39.0–52.0)
Hemoglobin: 15.5 g/dL (ref 13.0–17.0)
Immature Granulocytes: 1 %
Lymphocytes Relative: 21 %
Lymphs Abs: 1.7 10*3/uL (ref 0.7–4.0)
MCH: 29.1 pg (ref 26.0–34.0)
MCHC: 32.8 g/dL (ref 30.0–36.0)
MCV: 88.9 fL (ref 80.0–100.0)
Monocytes Absolute: 0.4 10*3/uL (ref 0.1–1.0)
Monocytes Relative: 5 %
Neutro Abs: 5.6 10*3/uL (ref 1.7–7.7)
Neutrophils Relative %: 68 %
Platelets: 110 10*3/uL — ABNORMAL LOW (ref 150–400)
RBC: 5.32 MIL/uL (ref 4.22–5.81)
RDW: 14.9 % (ref 11.5–15.5)
WBC: 8.1 10*3/uL (ref 4.0–10.5)
nRBC: 0 % (ref 0.0–0.2)

## 2022-03-17 LAB — RAPID URINE DRUG SCREEN, HOSP PERFORMED
Amphetamines: NOT DETECTED
Barbiturates: NOT DETECTED
Benzodiazepines: NOT DETECTED
Cocaine: NOT DETECTED
Opiates: POSITIVE — AB
Tetrahydrocannabinol: NOT DETECTED

## 2022-03-17 LAB — URINALYSIS, ROUTINE W REFLEX MICROSCOPIC
Bacteria, UA: NONE SEEN
Bilirubin Urine: NEGATIVE
Glucose, UA: NEGATIVE mg/dL
Ketones, ur: NEGATIVE mg/dL
Leukocytes,Ua: NEGATIVE
Nitrite: NEGATIVE
Protein, ur: NEGATIVE mg/dL
Specific Gravity, Urine: 1.008 (ref 1.005–1.030)
pH: 5 (ref 5.0–8.0)

## 2022-03-17 LAB — TROPONIN I (HIGH SENSITIVITY): Troponin I (High Sensitivity): 4 ng/L (ref <18)

## 2022-03-17 LAB — COMPREHENSIVE METABOLIC PANEL
ALT: 53 U/L — ABNORMAL HIGH (ref 0–44)
AST: 43 U/L — ABNORMAL HIGH (ref 15–41)
Albumin: 4 g/dL (ref 3.5–5.0)
Alkaline Phosphatase: 65 U/L (ref 38–126)
Anion gap: 9 (ref 5–15)
BUN: 19 mg/dL (ref 8–23)
CO2: 22 mmol/L (ref 22–32)
Calcium: 9.1 mg/dL (ref 8.9–10.3)
Chloride: 106 mmol/L (ref 98–111)
Creatinine, Ser: 1.16 mg/dL (ref 0.61–1.24)
GFR, Estimated: >60 mL/min (ref >60)
Glucose, Bld: 212 mg/dL — ABNORMAL HIGH (ref 70–99)
Potassium: 3 mmol/L — ABNORMAL LOW (ref 3.5–5.1)
Sodium: 137 mmol/L (ref 135–145)
Total Bilirubin: 0.9 mg/dL (ref 0.3–1.2)
Total Protein: 7.2 g/dL (ref 6.5–8.1)

## 2022-03-17 LAB — TSH: TSH: 1.882 u[IU]/mL (ref 0.350–4.500)

## 2022-03-17 LAB — CBG MONITORING, ED
Glucose-Capillary: 226 mg/dL — ABNORMAL HIGH (ref 70–99)
Glucose-Capillary: 145 mg/dL — ABNORMAL HIGH (ref 70–99)

## 2022-03-17 LAB — C DIFFICILE QUICK SCREEN W PCR REFLEX
C Diff antigen: NEGATIVE
C Diff interpretation: NOT DETECTED
C Diff toxin: NEGATIVE

## 2022-03-17 LAB — VITAMIN B12: Vitamin B-12: 771 pg/mL (ref 180–914)

## 2022-03-17 LAB — FOLATE: Folate: 23.4 ng/mL (ref >5.9)

## 2022-03-17 LAB — APTT: aPTT: 28 seconds (ref 24–36)

## 2022-03-17 LAB — BLOOD GAS, ARTERIAL
Acid-Base Excess: 1.9 mmol/L (ref 0.0–2.0)
Bicarbonate: 26.6 mmol/L (ref 20.0–28.0)
Drawn by: 23430
FIO2: 28 %
O2 Saturation: 98 %
Patient temperature: 36.9
pCO2 arterial: 41 mmHg (ref 32–48)
pH, Arterial: 7.42 (ref 7.35–7.45)
pO2, Arterial: 78 mmHg — ABNORMAL LOW (ref 83–108)

## 2022-03-17 LAB — T4, FREE: Free T4: 1.16 ng/dL — ABNORMAL HIGH (ref 0.61–1.12)

## 2022-03-17 LAB — PROCALCITONIN: Procalcitonin: 0.71 ng/mL

## 2022-03-17 LAB — GLUCOSE, CAPILLARY: Glucose-Capillary: 296 mg/dL — ABNORMAL HIGH (ref 70–99)

## 2022-03-17 LAB — ETHANOL: Alcohol, Ethyl (B): <10 mg/dL (ref <10)

## 2022-03-17 LAB — LACTIC ACID, PLASMA
Lactic Acid, Venous: 2 mmol/L (ref 0.5–1.9)
Lactic Acid, Venous: 1.7 mmol/L (ref 0.5–1.9)

## 2022-03-17 LAB — MAGNESIUM: Magnesium: 2.3 mg/dL (ref 1.7–2.4)

## 2022-03-17 LAB — PROTIME-INR
INR: 1.2 (ref 0.8–1.2)
Prothrombin Time: 14.8 seconds (ref 11.4–15.2)

## 2022-03-17 LAB — AMMONIA: Ammonia: 33 umol/L (ref 9–35)

## 2022-03-17 MED ORDER — ALBUTEROL SULFATE (2.5 MG/3ML) 0.083% IN NEBU
10.0000 mg | INHALATION_SOLUTION | RESPIRATORY_TRACT | Status: AC
Start: 2022-03-17 — End: 2022-03-17
  Administered 2022-03-17: 10 mg via RESPIRATORY_TRACT
  Filled 2022-03-17: qty 12

## 2022-03-17 MED ORDER — PANTOPRAZOLE SODIUM 40 MG PO TBEC
40.0000 mg | DELAYED_RELEASE_TABLET | Freq: Two times a day (BID) | ORAL | Status: DC
  Administered 2022-03-17: 40 mg via ORAL
  Filled 2022-03-17 (×2): qty 1

## 2022-03-17 MED ORDER — SODIUM CHLORIDE 0.9 % IV BOLUS
1000.0000 mL | Freq: Once | INTRAVENOUS | Status: AC
  Administered 2022-03-17: 1000 mL via INTRAVENOUS

## 2022-03-17 MED ORDER — MAGNESIUM SULFATE 2 GM/50ML IV SOLN
2.0000 g | Freq: Once | INTRAVENOUS | Status: AC
Start: 2022-03-17 — End: 2022-03-17
  Administered 2022-03-17: 2 g via INTRAVENOUS
  Filled 2022-03-17: qty 50

## 2022-03-17 MED ORDER — OXYCODONE HCL 5 MG PO TABS
5.0000 mg | ORAL_TABLET | Freq: Once | ORAL | Status: DC
  Filled 2022-03-17: qty 1

## 2022-03-17 MED ORDER — POTASSIUM CHLORIDE CRYS ER 20 MEQ PO TBCR
20.0000 meq | EXTENDED_RELEASE_TABLET | Freq: Once | ORAL | Status: AC
Start: 2022-03-17 — End: 2022-03-17
  Administered 2022-03-17: 20 meq via ORAL
  Filled 2022-03-17: qty 1

## 2022-03-17 MED ORDER — POTASSIUM CHLORIDE 10 MEQ/100ML IV SOLN
10.0000 meq | Freq: Once | INTRAVENOUS | Status: AC
  Administered 2022-03-17: 10 meq via INTRAVENOUS
  Filled 2022-03-17: qty 100

## 2022-03-17 MED ORDER — ACETAMINOPHEN 325 MG PO TABS
650.0000 mg | ORAL_TABLET | Freq: Four times a day (QID) | ORAL | Status: DC | PRN
  Administered 2022-03-17: 650 mg via ORAL
  Filled 2022-03-17: qty 2

## 2022-03-17 MED ORDER — BUDESONIDE 0.5 MG/2ML IN SUSP
0.5000 mg | Freq: Every day | RESPIRATORY_TRACT | Status: DC | PRN
Start: 2022-03-17 — End: 2022-03-18

## 2022-03-17 MED ORDER — FOLIC ACID 1 MG PO TABS
1.0000 mg | ORAL_TABLET | Freq: Every day | ORAL | Status: DC
  Administered 2022-03-17: 1 mg via ORAL
  Filled 2022-03-17 (×2): qty 1

## 2022-03-17 MED ORDER — ONDANSETRON HCL 4 MG/2ML IJ SOLN: 4.0000 mg | Freq: Four times a day (QID) | INTRAMUSCULAR | Status: DC | PRN

## 2022-03-17 MED ORDER — ONDANSETRON HCL 4 MG/2ML IJ SOLN
4.0000 mg | Freq: Once | INTRAMUSCULAR | Status: AC
  Administered 2022-03-17: 4 mg via INTRAVENOUS
  Filled 2022-03-17: qty 2

## 2022-03-17 MED ORDER — BUPROPION HCL ER (XL) 150 MG PO TB24
150.0000 mg | ORAL_TABLET | Freq: Every day | ORAL | Status: DC
  Filled 2022-03-17: qty 1

## 2022-03-17 MED ORDER — THIAMINE HCL 100 MG/ML IJ SOLN
INTRAMUSCULAR | Status: AC
  Filled 2022-03-17: qty 6

## 2022-03-17 MED ORDER — SODIUM CHLORIDE 0.9 % IV SOLN
INTRAVENOUS | Status: DC
  Administered 2022-03-17: 125 mL via INTRAVENOUS

## 2022-03-17 MED ORDER — CLONAZEPAM 0.5 MG PO TABS
0.5000 mg | ORAL_TABLET | Freq: Once | ORAL | Status: AC
  Administered 2022-03-17: 0.5 mg via ORAL
  Filled 2022-03-17: qty 1

## 2022-03-17 MED ORDER — CITALOPRAM HYDROBROMIDE 20 MG PO TABS: 10.0000 mg | ORAL_TABLET | Freq: Every day | ORAL | Status: DC

## 2022-03-17 MED ORDER — SALINE SPRAY 0.65 % NA SOLN
1.0000 | NASAL | Status: DC | PRN
  Administered 2022-03-17: 1 via NASAL
  Filled 2022-03-17: qty 44

## 2022-03-17 MED ORDER — SODIUM CHLORIDE 0.9 % IV SOLN
500.0000 mg | Freq: Three times a day (TID) | INTRAVENOUS | Status: DC
  Administered 2022-03-17 (×2): 500 mg via INTRAVENOUS
  Filled 2022-03-17 (×6): qty 5

## 2022-03-17 MED ORDER — INSULIN ASPART 100 UNIT/ML IJ SOLN
0.0000 [IU] | Freq: Three times a day (TID) | INTRAMUSCULAR | Status: DC
  Administered 2022-03-17: 2 [IU] via SUBCUTANEOUS
  Filled 2022-03-17: qty 1

## 2022-03-17 MED ORDER — IOHEXOL 300 MG/ML  SOLN
100.0000 mL | Freq: Once | INTRAMUSCULAR | Status: AC | PRN
  Administered 2022-03-17: 100 mL via INTRAVENOUS

## 2022-03-17 MED ORDER — ACETAMINOPHEN 650 MG RE SUPP: 650.0000 mg | Freq: Four times a day (QID) | RECTAL | Status: DC | PRN

## 2022-03-17 MED ORDER — ROSUVASTATIN CALCIUM 20 MG PO TABS
40.0000 mg | ORAL_TABLET | Freq: Every day | ORAL | Status: DC
  Administered 2022-03-17: 40 mg via ORAL
  Filled 2022-03-17: qty 2

## 2022-03-17 MED ORDER — NICOTINE 21 MG/24HR TD PT24
21.0000 mg | MEDICATED_PATCH | Freq: Every day | TRANSDERMAL | Status: DC
  Administered 2022-03-17: 21 mg via TRANSDERMAL
  Filled 2022-03-17 (×2): qty 1

## 2022-03-17 MED ORDER — INSULIN ASPART 100 UNIT/ML IJ SOLN
0.0000 [IU] | Freq: Every day | INTRAMUSCULAR | Status: DC
  Administered 2022-03-17: 3 [IU] via SUBCUTANEOUS

## 2022-03-17 MED ORDER — MELATONIN 3 MG PO TABS: 6.0000 mg | ORAL_TABLET | Freq: Every day | ORAL | Status: DC

## 2022-03-17 MED ORDER — ONDANSETRON HCL 4 MG PO TABS: 4.0000 mg | ORAL_TABLET | Freq: Four times a day (QID) | ORAL | Status: DC | PRN

## 2022-03-17 MED ORDER — THIAMINE HCL 100 MG/ML IJ SOLN
100.0000 mg | Freq: Once | INTRAMUSCULAR | Status: AC
  Administered 2022-03-17: 100 mg via INTRAVENOUS
  Filled 2022-03-17: qty 2

## 2022-03-17 MED ORDER — POTASSIUM CHLORIDE IN NACL 20-0.9 MEQ/L-% IV SOLN
INTRAVENOUS | Status: DC
  Filled 2022-03-17: qty 1000

## 2022-03-17 MED ORDER — POTASSIUM CHLORIDE CRYS ER 20 MEQ PO TBCR
40.0000 meq | EXTENDED_RELEASE_TABLET | Freq: Two times a day (BID) | ORAL | Status: DC
  Administered 2022-03-17: 40 meq via ORAL
  Filled 2022-03-17 (×2): qty 2

## 2022-03-17 MED ORDER — CLOPIDOGREL BISULFATE 75 MG PO TABS
75.0000 mg | ORAL_TABLET | Freq: Every day | ORAL | Status: DC
  Filled 2022-03-17: qty 1

## 2022-03-17 MED ORDER — ENOXAPARIN SODIUM 40 MG/0.4ML IJ SOSY
40.0000 mg | PREFILLED_SYRINGE | INTRAMUSCULAR | Status: DC
Start: 2022-03-17 — End: 2022-03-18
  Administered 2022-03-17: 40 mg via SUBCUTANEOUS
  Filled 2022-03-17: qty 0.4

## 2022-03-17 MED ORDER — LORATADINE 10 MG PO TABS
10.0000 mg | ORAL_TABLET | Freq: Every day | ORAL | Status: DC
  Administered 2022-03-17: 10 mg via ORAL
  Filled 2022-03-17 (×2): qty 1

## 2022-03-17 NOTE — Assessment & Plan Note (Signed)
Stable on room air 

## 2022-03-17 NOTE — Assessment & Plan Note (Signed)
Check C. Difficile Stool pathogen panel

## 2022-03-17 NOTE — Progress Notes (Signed)
Responded to nursing call:  patient wanting to leave hospital     Vitals:   03/17/22 1332 03/17/22 1345 03/17/22 1445 03/17/22 1521  BP: 120/76 111/78 110/85 102/65  Pulse: (!) 53 (!) 54 (!) 56 (!) 57  Resp: 13 11 18 13   Temp:      TempSrc:      SpO2: 100% 99% 98% 95%  Weight:      Height:       I discussed with patient and spouse at bedside the risks of leaving the hospital including but not limited to worsening of his clinical condition, overwhelming infection, and death.  He expressed understanding.  His wife also wanted him to stay.  After discussion of risks, benefits, alternatives, patient agrees to stay.  I told him if wants to leave, he would have to leave AMA   , DO [facility]

## 2022-03-17 NOTE — H&P (Signed)
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  The patient's wife at the bedside supplements the history.  Apparently, the patient had been in his usual state of health until 03/16/2022 when he was feeling dizzy and noted himself to be hypoglycemic.  Apparently the patient had a number of falls on 03/16/2022.  Apparently his sugars were as low as 30-50.  He had to give himself 2 doses of glucagon and subsequently had to eat some candy before being able to get his sugars up into the 110 range.  Even after normalization of his sugars, the patient still had gait instability and postural instability and some confusion according to his wife.  He had difficulty even sitting on the edge of the bed without falling over.  His wife tried to convince him to go to the emergency department, but the patient refused.  When the patient woke up on the morning of 03/17/2022, he remained confused.  He continued to have postural instability and gait instability.  When he tried to stand up and sit up he will fall to 1 side on the bed.  His wife checked his CBG and it was 117.  Patient's wife states that the patient had been in his usual state of health until 03/16/2022.  She states that he has been eating fine.  There is not been any new medications.  The patient is on numerous hypnotic and opioid medications which he self administers.  His wife states that he is usually vigilant about taking the  proper dosing.  The patient himself denies any fever, chills, headache, chest pain, shortness of breath, coughing, vomiting, hematemesis, hematochezia, melena.  He has had generalized abdominal discomfort for the better part of the past 1 to 2 months.  He had a recent hospital admission to [facility] long hospital from 12/31/2021 to 01/02/2022 for abdominal pain and discomfort.  It was attributable to the patient's Ozempic which she has been told to discontinue.  The patient states that he has not used Ozempic for nearly 2 months now. Notably, the patient states that he has been having 5-6 loose stools on a daily basis.  He states that this has been going on for the last 2 to 3 weeks.  He was treated with Augmentin in the first week of July for a dental infection.  He denies any hematochezia or melena. Patient states that he took his usual dose of Toujeo 50 units on the morning of 03/17/2022. In the ED, the patient was initially obtunded.  He was afebrile and hemodynamically stable albeit with soft blood pressures.  Oxygen saturation was 100% on room air.  BMP showed sodium 137, potassium 3.0, bicarbonate 22, serum creatinine 1.16.  AST 43, ALT 53, alk phosphatase 65, total bilirubin 0.9.  Ammonia 33.  Magnesium 2.3.  WBC 8.1, hemoglobin 15.5, platelets 110,000.  Lactic acid was initially 2.0>> 1.7.  EKG shows sinus rhythm and nonspecific T wave changes.  The patient was given a potassium IV 10 mEq.  He was  given normal saline bolus and started on maintenance fluid.  CT of the abdomen and pelvis did not show any bowel obstruction or bowel thickening.  It showed a nodular liver with suggestion of gastric varices.  Portal vein was patent.  CT of the chest showed bibasilar atelectasis.  There is a small amount of mucoid debris in the distal trachea.  The patient was admitted for further evaluation and treatment of his encephalopathy.  Review of Systems: As mentioned in the history of present illness. All other systems  reviewed and are negative. Past Medical History:  Diagnosis Date   Anxiety    Ascites    Chronic pain syndrome    CKD (chronic kidney disease), stage III (HCC)    COPD (chronic obstructive pulmonary disease) (HCC)    Diabetes mellitus without complication ([place])    Essential hypertension    GERD (gastroesophageal reflux disease)    Heart attack ([place])    Hyperlipidemia    Insomnia    Sleep apnea    Past Surgical History:  Procedure Laterality Date   ABDOMINAL AORTIC ENDOVASCULAR STENT GRAFT  2023   APPENDECTOMY     BACK SURGERY     BIOPSY  12/18/2019   Procedure: BIOPSY;  Surgeon: Milus Banister, MD;  Location: [facility] ENDOSCOPY;  Service: Endoscopy;;   CARDIAC CATHETERIZATION     CHOLECYSTECTOMY     COLONOSCOPY WITH PROPOFOL N/A 12/18/2019   Procedure: COLONOSCOPY WITH PROPOFOL;  Surgeon: Milus Banister, MD;  Location: [facility] ENDOSCOPY;  Service: Endoscopy;  Laterality: N/A;   ESOPHAGOGASTRODUODENOSCOPY (EGD) WITH PROPOFOL N/A 12/18/2019   Procedure: ESOPHAGOGASTRODUODENOSCOPY (EGD) WITH PROPOFOL;  Surgeon: Milus Banister, MD;  Location: [facility] ENDOSCOPY;  Service: Endoscopy;  Laterality: N/A;   Social History:  reports that he has been smoking cigarettes. He has never used smokeless tobacco. He reports current alcohol use. He reports that he does not currently use drugs.  Allergies  Allergen Reactions   Latex Rash   Ambien [Zolpidem] Other (See Comments)   Carafate [Sucralfate] Hives and Itching   Lexapro [Escitalopram Oxalate] Nausea Only    Family History  Problem Relation Age of Onset   Breast cancer Mother    Diabetes Father    Heart disease Father    Lung disease Father    Heart disease Sister    Hypertension Sister    Lung disease Sister    Heart disease Brother     Prior to Admission medications   Medication Sig Start Date End Date Taking? Authorizing Provider  budesonide (PULMICORT) 0.5 MG/2ML nebulizer solution Take 0.5 mg by nebulization daily as needed  (sob/wheezing).    [provider]  buPROPion (WELLBUTRIN XL) 150 MG 24 hr tablet Take 150 mg by mouth at bedtime.    [provider]  cetirizine (ZYRTEC) 10 MG tablet Take 10 mg by mouth daily. 12/23/21   [provider]  citalopram (CELEXA) 10 MG tablet Take 10 mg by mouth at bedtime.  10/27/19   [provider]  clonazePAM (KLONOPIN) 0.5 MG tablet Take 0.5 mg by mouth 2 (two) times daily. 10/07/18   [provider]  clopidogrel (PLAVIX) 75 MG tablet Take 75 mg by mouth daily.    [provider]  dicyclomine (BENTYL) 20 MG tablet Take 1 tablet (20 mg total) by mouth 2 (two) times daily. 12/28/21   Marcello Fennel, PA-C  FERROUS SULFATE PO Take 1 tablet by mouth at bedtime.    [provider]  folic acid (FOLVITE)  1 MG tablet Take 1 mg by mouth daily.    [provider]  furosemide (LASIX) 20 MG tablet Take 20 mg by mouth daily as needed for fluid.    [provider]  Glucagon, rDNA, (GLUCAGON EMERGENCY) 1 MG KIT Inject 1 mL into the muscle daily as needed (low blood sugar). 10/04/21   [provider]  hyoscyamine (LEVSIN SL) 0.125 MG SL tablet Place 1 tablet (0.125 mg total) under the tongue every 6 (six) hours as needed for cramping. 12/30/21   Vladimir Crofts, PA-C  insulin glargine, 2 Unit Dial, (TOUJEO MAX SOLOSTAR) 300 UNIT/ML Solostar Pen Inject 80 Units into the skin every morning. And pen needles 2/day Patient taking differently: Inject 38 Units into the skin every morning. And pen needles 2/day 01/12/21   Renato Shin, MD  insulin lispro (HUMALOG KWIKPEN) 100 UNIT/ML KwikPen Inject 50 Units into the skin daily with breakfast. 01/01/22   Allie Bossier, MD  metoCLOPramide (REGLAN) 10 MG tablet Take 10 mg by mouth in the morning, at noon, and at bedtime.    [provider]  naloxone [facility]) 4 MG/0.1ML LIQD nasal spray kit Place 1 spray into the nose as needed (accidental overdose.).  09/23/18    [provider]  pantoprazole (PROTONIX) 40 MG tablet Take 40 mg by mouth 2 (two) times daily.    [provider]  pregabalin (LYRICA) 200 MG capsule Take 200 mg by mouth in the morning, at noon, and at bedtime.  10/16/18   [provider]  propranolol (INDERAL) 10 MG tablet TAKE 1 TABLET BY MOUTH THREE TIMES A DAY Patient taking differently: Take 10-20 mg by mouth as directed. Take 20 mg every morning, 10 mg every evening 08/26/21   Milus Banister, MD  rosuvastatin (CRESTOR) 40 MG tablet Take 40 mg by mouth at bedtime. 11/10/21   [provider]  triamcinolone cream (KENALOG) 0.1 % 1 application. daily as needed (rash). 02/14/21   [provider]  vancomycin (VANCOCIN) 125 MG capsule Take 125 mg by mouth 4 (four) times daily. 12/23/21   [provider]  XTAMPZA ER 9 MG C12A Take 1 capsule by mouth in the morning, at noon, and at bedtime. 03/28/20   [provider]    Physical Exam: Vitals:   03/17/22 1300 03/17/22 1322 03/17/22 1332 03/17/22 1345  BP: 111/77  120/76 111/78  Pulse: (!) 54  (!) 53 (!) 54  Resp: 10  13 11   Temp:  (!) 97.4 F (36.3 C)    TempSrc:  Oral    SpO2: 99%  100% 99%  Weight:      Height:       GENERAL:  A&O x 3, NAD, well developed, cooperative, follows commands HEENT: /AT, No thrush, No icterus, No oral ulcers Neck:  No neck mass, No meningismus, soft, supple CV: RRR, no S3, no S4, no rub, no JVD Lungs:  bibasilar rales. No wheeze Abd: soft/NT +BS, nondistended Ext: No edema, no lymphangitis, no cyanosis, no rashes Neuro:  CN II-XII intact, strength 4/5 in RUE, RLE, strength 4/5 LUE, LLE; sensation intact bilateral; no dysmetria; babinski equivocal  Data Reviewed: Data reviewed in history above  Assessment and Plan: * Acute metabolic encephalopathy Multifactorial including polypharmacy, hypoglycemia, volume depletion and possibly hypotension and a component of alcohol withdrawal with Warnicke's  encephalopathy Ammonia 33 Check TSH Check B12 Check thiamine level Start high-dose thiamine UA negative for pyuria CT brain negative No focal neurologic deficits on exam  Diarrhea Check C. Difficile Stool pathogen panel  Opioid dependence ([place]) PDMP reviewed MS Contin 15 mg #60--last refill 03/07/2022 Pregabalin 200 mg, #90--last refill 03/02/2022 Clonazepam 0.5 mg, #90--last refill 02/23/2022  Tobacco abuse Tobacco cessation discussed NicoDerm patch  Alcohol abuse Patient has not drank in 1 week He is likely outside the window of withdrawal Start high-dose thiamine  Obesity (BMI 30-39.9) BMI 33.23 Lifestyle modification  HLD (hyperlipidemia) Continue Crestor  COPD mixed type (HCC) Stable on room air  Hepatic cirrhosis ([place]) Appears clinically compensated Hold inderal due to soft BPs  Uncontrolled type 2 diabetes mellitus with hyperglycemia (HCC) 12/31/2021 hemoglobin A1c 8.0 NovoLog sliding scale Holding long-acting insulin temporarily and monitor CBGs      Advance Care Planning: FULL  Consults: none  Family Communication: spouse 7/28  Severity of Illness: The appropriate patient status for this patient is OBSERVATION. Observation status is judged to be reasonable and necessary in order to provide the required intensity of service to ensure the patient's safety. The patient's presenting symptoms, physical exam findings, and initial radiographic and laboratory data in the context of their medical condition is felt to place them at decreased risk for further clinical deterioration. Furthermore, it is anticipated that the patient will be medically stable for discharge from the hospital within 2 midnights of admission.   Author: Orson Eva, MD 03/17/2022 2:32 PM  For on call review [email or web address].

## 2022-03-17 NOTE — ED Triage Notes (Signed)
Patient to ED via EMS with reports of AMS, frequent falls, hypoglycemia, dizziness since last night per wife. Patient reports overall not feeling well. Reports generalized pain, 10/10. Wife states that BS yesterday was in 33's. 226 on arrival to ED.

## 2022-03-17 NOTE — Assessment & Plan Note (Signed)
BMI 33.23 Lifestyle modification

## 2022-03-17 NOTE — Assessment & Plan Note (Addendum)
Appears clinically compensated Hold inderal due to soft BPs

## 2022-03-17 NOTE — Assessment & Plan Note (Addendum)
Multifactorial including polypharmacy, hypoglycemia, volume depletion and possibly hypotension and a component of alcohol withdrawal with Warnicke's encephalopathy Ammonia 33 Check TSH Check B12 Check thiamine level Start high-dose thiamine UA negative for pyuria CT brain negative No focal neurologic deficits on exam

## 2022-03-17 NOTE — Progress Notes (Signed)
Asked pt if he has fallen at home. He stated that he has fallen multiple times. He stated both legs have given out on him causing him to fall. I told pt we did not want him to fall while in the hospital. Adv him that for his safety I would place a bed alarm on his bed to remind him not to get up without assistance and that same alarm will alert staff that he needs help. He refused bed alarm. Stated he can walk short distances without help. I asked that he please call us for assistance to the bathroom. Pt states he will call if he feels unsteady. Charge RN made aware.

## 2022-03-17 NOTE — ED Notes (Signed)
Hospitalist at bedside 

## 2022-03-17 NOTE — ED Notes (Signed)
Pt CBG is 226. Nurse aware

## 2022-03-17 NOTE — Assessment & Plan Note (Signed)
PDMP reviewed MS Contin 15 mg #60--last refill 03/07/2022 Pregabalin 200 mg, #90--last refill 03/02/2022 Clonazepam 0.5 mg, #90--last refill 02/23/2022

## 2022-03-17 NOTE — Assessment & Plan Note (Signed)
Tobacco cessation discussed NicoDerm patch 

## 2022-03-17 NOTE — ED Provider Notes (Signed)
[facility] EMERGENCY DEPARTMENT Provider Note   CSN: [id number] Arrival date & time: 03/17/22  3299     History {Add pertinent medical, surgical, social history, OB history to HPI:1} Chief Complaint  Patient presents with   Altered Mental Status    Spencer Fletcher is a 61 y.o. male.   Altered Mental Status  This patient is a 61 year old male who unfortunately has a history of liver failure and recurrent ascites secondary to alcoholism.  He has a history of frequent falls as of the last few days and some recent hypoglycemia.  Yesterday it was too low to measure and it required multiple doses of glucagon and some candy to get it back up into a more normal range according to the wife.  According to the wife he was confused last night and has had several falls but had refused to come to the emergency department.  Today he was looking much worse and asking for help repeating I need help and that he was feeling sick over and over.  He does not give much more in the way of information.  The wife denies that there has been any vomiting diarrhea or black stools.  Based on an EGD performed in 2021 the patient has large esophageal varices and gastric varices but there is been no evidence of bleeding recently according to the wife.  He has been taking his medications as prescribed including his diabetic medications.  Additionally the patient takes Pulmicort, Wellbutrin, Plavix, she reports that he is on Eliquis because of multiple cardiac stents in the past, he also takes propanolol.  Additionally the patient's spouse states that he stopped drinking about 1 week ago, it was a gradual taper.  She states that she is a Equities trader and has been helping him get off of alcohol      Home Medications Prior to Admission medications   Medication Sig Start Date End Date Taking? Authorizing Provider  budesonide (PULMICORT) 0.5 MG/2ML nebulizer solution Take 0.5 mg by nebulization daily as needed  (sob/wheezing).    [provider]  buPROPion (WELLBUTRIN XL) 150 MG 24 hr tablet Take 150 mg by mouth at bedtime.    [provider]  cetirizine (ZYRTEC) 10 MG tablet Take 10 mg by mouth daily. 12/23/21   [provider]  citalopram (CELEXA) 10 MG tablet Take 10 mg by mouth at bedtime.  10/27/19   [provider]  clonazePAM (KLONOPIN) 0.5 MG tablet Take 0.5 mg by mouth 2 (two) times daily. 10/07/18   [provider]  clopidogrel (PLAVIX) 75 MG tablet Take 75 mg by mouth daily.    [provider]  dicyclomine (BENTYL) 20 MG tablet Take 1 tablet (20 mg total) by mouth 2 (two) times daily. 12/28/21   Marcello Fennel, PA-C  FERROUS SULFATE PO Take 1 tablet by mouth at bedtime.    [provider]  folic acid (FOLVITE) 1 MG tablet Take 1 mg by mouth daily.    [provider]  furosemide (LASIX) 20 MG tablet Take 20 mg by mouth daily as needed for fluid.    [provider]  Glucagon, rDNA, (GLUCAGON EMERGENCY) 1 MG KIT Inject 1 mL into the muscle daily as needed (low blood sugar). 10/04/21   [provider]  hyoscyamine (LEVSIN SL) 0.125 MG SL tablet Place 1 tablet (0.125 mg total) under the tongue every 6 (six) hours as needed for cramping. 12/30/21   Vladimir Crofts, PA-C  insulin glargine, 2  Unit Dial, (TOUJEO MAX SOLOSTAR) 300 UNIT/ML Solostar Pen Inject 80 Units into the skin every morning. And pen needles 2/day Patient taking differently: Inject 38 Units into the skin every morning. And pen needles 2/day 01/12/21   Renato Shin, MD  insulin lispro (HUMALOG KWIKPEN) 100 UNIT/ML KwikPen Inject 50 Units into the skin daily with breakfast. 01/01/22   Allie Bossier, MD  metoCLOPramide (REGLAN) 10 MG tablet Take 10 mg by mouth in the morning, at noon, and at bedtime.    [provider]  naloxone [facility]) 4 MG/0.1ML LIQD nasal spray kit Place 1 spray into the nose as needed (accidental overdose.).  09/23/18    [provider]  pantoprazole (PROTONIX) 40 MG tablet Take 40 mg by mouth 2 (two) times daily.    [provider]  pregabalin (LYRICA) 200 MG capsule Take 200 mg by mouth in the morning, at noon, and at bedtime.  10/16/18   [provider]  propranolol (INDERAL) 10 MG tablet TAKE 1 TABLET BY MOUTH THREE TIMES A DAY Patient taking differently: Take 10-20 mg by mouth as directed. Take 20 mg every morning, 10 mg every evening 08/26/21   Milus Banister, MD  rosuvastatin (CRESTOR) 40 MG tablet Take 40 mg by mouth at bedtime. 11/10/21   [provider]  triamcinolone cream (KENALOG) 0.1 % 1 application. daily as needed (rash). 02/14/21   [provider]  vancomycin (VANCOCIN) 125 MG capsule Take 125 mg by mouth 4 (four) times daily. 12/23/21   [provider]  XTAMPZA ER 9 MG C12A Take 1 capsule by mouth in the morning, at noon, and at bedtime. 03/28/20   [provider]      Allergies    Latex, Ambien [zolpidem], Carafate [sucralfate], and Lexapro [escitalopram oxalate]    Review of Systems   Review of Systems  All other systems reviewed and are negative.   Physical Exam Updated Vital Signs BP 112/75   Pulse (!) 58   Resp 15   Ht 1.753 m (5' 9" )   Wt 102.1 kg   SpO2 92%   BMI 33.23 kg/m  Physical Exam Vitals and nursing note reviewed.  Constitutional:      General: He is in acute distress.     Appearance: He is well-developed. He is ill-appearing.  HENT:     Head: Normocephalic and atraumatic.     Mouth/Throat:     Mouth: Mucous membranes are dry.     Pharynx: No oropharyngeal exudate.  Eyes:     General: No scleral icterus.       Right eye: No discharge.        Left eye: No discharge.     Conjunctiva/sclera: Conjunctivae normal.     Pupils: Pupils are equal, round, and reactive to light.  Neck:     Thyroid: No thyromegaly.     Vascular: No JVD.  Cardiovascular:     Rate and Rhythm: Normal rate and regular rhythm.      Heart sounds: Normal heart sounds. No murmur heard.    No friction rub. No gallop.  Pulmonary:     Effort: Respiratory distress present.     Breath sounds: Wheezing present. No rales.     Comments: Mild tachypnea, expiratory wheezing, no rales Abdominal:     General: Bowel sounds are normal. There is no distension.     Palpations: Abdomen is soft. There is no mass.     Tenderness: There is abdominal tenderness.  Comments: Soft abdomen, no distention, no fluid wave, no tympanitic sounds to percussion, minimal tenderness, no guarding  Musculoskeletal:        General: No tenderness. Normal range of motion.     Cervical back: Normal range of motion and neck supple.     Right lower leg: No edema.     Left lower leg: No edema.  Lymphadenopathy:     Cervical: No cervical adenopathy.  Skin:    General: Skin is warm and dry.     Findings: Bruising present. No erythema or rash.     Comments: Scattered bruising of different stages of healing  Neurological:     Mental Status: He is alert.     Comments: The patient is severely weak, he is somnolent but arousable to loud voice, he is able to move all 4 extremities minimally, severely generally weak, no obvious facial, answers questions with 1 or 2 word answers  Psychiatric:        Behavior: Behavior normal.     ED Results / Procedures / Treatments   Labs (all labs ordered are listed, but only abnormal results are displayed) Labs Reviewed  CBG MONITORING, ED - Abnormal; Notable for the following components:      Result Value   Glucose-Capillary 226 (*)    All other components within normal limits  COMPREHENSIVE METABOLIC PANEL  CBC WITH DIFFERENTIAL/PLATELET  URINALYSIS, ROUTINE W REFLEX MICROSCOPIC  AMMONIA  LACTIC ACID, PLASMA  BLOOD GAS, ARTERIAL  PROTIME-INR  APTT    EKG None  Radiology No results found.  Procedures Procedures  {Document cardiac monitor, telemetry assessment procedure when  appropriate:1}  Medications Ordered in ED Medications  0.9 %  sodium chloride infusion (has no administration in time range)  thiamine (VITAMIN B1) injection 100 mg (has no administration in time range)  albuterol (PROVENTIL,VENTOLIN) solution continuous neb (has no administration in time range)  ondansetron (ZOFRAN) injection 4 mg (has no administration in time range)    ED Course/ Medical Decision Making/ A&P                           Medical Decision Making Amount and/or Complexity of Data Reviewed Labs: ordered. Radiology: ordered.  Risk Prescription drug management.   This patient presents to the ED for concern of hypoglycemia and altered mental status, multiple falls, this involves an extensive number of treatment options, and is a complaint that carries with it a high risk of complications and morbidity.  The differential diagnosis includes trauma, intracranial injury, hemorrhage, electrolyte abnormalities, vomiting, gastrointestinal illness, medication reactions   Co morbidities that complicate the patient evaluation  Alcohol abuse, altered mental status, diabetes, heart disease, known esophageal varices   Additional history obtained:  Additional history obtained from electronic medical record External records from outside source obtained and reviewed including prior EGD notes   Lab Tests:  I Ordered, and personally interpreted labs.  The pertinent results include:  ***   Imaging Studies ordered:  I ordered imaging studies including ***  I independently visualized and interpreted imaging which showed *** I agree with the radiologist interpretation   Cardiac Monitoring: / EKG:  The patient was maintained on a cardiac monitor.  I personally viewed and interpreted the cardiac monitored which showed an underlying rhythm of: ***   Consultations Obtained:  I requested consultation with the ***,  and discussed lab and imaging findings as well as pertinent plan  - they recommend: ***  Problem List / ED Course / Critical interventions / Medication management  *** I ordered medication including ***  for ***  Reevaluation of the patient after these medicines showed that the patient {resolved/improved/worsened:23923::"improved"} I have reviewed the patients home medicines and have made adjustments as needed   Social Determinants of Health:  ***   Test / Admission - Considered:  ***   {Document critical care time when appropriate:1} {Document review of labs and clinical decision tools ie heart score, Chads2Vasc2 etc:1}  {Document your independent review of radiology images, and any outside records:1} {Document your discussion with family members, caretakers, and with consultants:1} {Document social determinants of health affecting pt's care:1} {Document your decision making why or why not admission, treatments were needed:1} Final Clinical Impression(s) / ED Diagnoses Final diagnoses:  None    Rx / DC Orders ED Discharge Orders     None

## 2022-03-17 NOTE — Progress Notes (Deleted)
Review of pertinent gastrointestinal problems: 1.  Routine risk for colon cancer.  Colonoscopy April 2021 found no polyps or cancers.  He did have prominent rectal vessels which may be portal hypertensive related. 2.  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  Prolonged hospital stay including several weeks in LTAC facility while intubated.  Temporary G tube feeding.  Presented to [facility] to establish care for 5 days after [facility] discharge. MELD-Na 11 (03/2021 labs) Liver imaging: MRI April 2021 showed cirrhosis, portal hypertension, no focal tumors in liver.  Minimal ascites.  [place] April 2022 cirrhosis without focal liver lesion. 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  Nonspecific gastritis which was H. pylori negative on biopsy.  Propranolol 10mg  tid started No alcohol intake since January 2021 Vaccinations: Immune to hepatitis B as of June 2021. 3.

## 2022-03-17 NOTE — Assessment & Plan Note (Signed)
Continue Crestor 

## 2022-03-17 NOTE — ED Notes (Signed)
Swelling noted to Right hand IV site  with 0.9%NACL with KCL @ 147ml/hr infusing. IV removed. Attending Tat MD made aware.

## 2022-03-17 NOTE — Hospital Course (Signed)
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  The patient's wife at the bedside supplements the history.  Apparently, the patient had been in his usual state of health until 03/16/2022 when he was feeling dizzy and noted himself to be hypoglycemic.  Apparently the patient had a number of falls on 03/16/2022.  Apparently his sugars were as low as 30-50.  He had to give himself 2 doses of glucagon and subsequently had to eat some candy before being able to get his sugars up into the 110 range.  Even after normalization of his sugars, the patient still had gait instability and postural instability and some confusion according to his wife.  He had difficulty even sitting on the edge of the bed without falling over.  His wife tried to convince him to go to the emergency department, but the patient refused.  When the patient woke up on the morning of 03/17/2022, he remained confused.  He continued to have postural instability and gait instability.  When he tried to stand up and sit up he will fall to 1 side on the bed.  His wife checked his CBG and it was 117.  Patient's wife states that the patient had been in his usual state of health until 03/16/2022.  She states that he has been eating fine.  There is not been any new medications.  The patient is on numerous hypnotic and opioid medications which he self administers.  His wife states that he is usually vigilant about taking the proper dosing.  The patient himself denies any fever, chills, headache, chest pain, shortness of breath, coughing, vomiting, hematemesis, hematochezia, melena.  He has had generalized abdominal discomfort for the better part of the past 1 to 2 months.  He had a recent hospital admission to [facility] hospital from  12/31/2021 to 01/02/2022 for abdominal pain and discomfort.  It was attributable to the patient's Ozempic which she has been told to discontinue.  The patient states that he has not used Ozempic for nearly 2 months now. Notably, the patient states that he has been having 5-6 loose stools on a daily basis.  He states that this has been going on for the last 2 to 3 weeks.  He was treated with Augmentin in the first week of July for a dental infection.  He denies any hematochezia or melena. Patient states that he took his usual dose of Toujeo 50 units on the morning of 03/17/2022. In the ED, the patient was initially obtunded.  He was afebrile and hemodynamically stable albeit with soft blood pressures.  Oxygen saturation was 100% on room air.  BMP showed sodium 137, potassium 3.0, bicarbonate 22, serum creatinine 1.16.  AST 43, ALT 53, alk phosphatase 65, total bilirubin 0.9.  Ammonia 33.  Magnesium 2.3.  WBC 8.1, hemoglobin 15.5, platelets 110,000.  Lactic acid was initially 2.0>> 1.7.  EKG shows sinus rhythm and nonspecific T wave changes.  The patient was given a potassium IV 10 mEq.  He was given normal saline bolus and started on maintenance fluid.  CT of the abdomen and pelvis did not show any bowel obstruction or bowel thickening.  It showed a nodular liver with suggestion of gastric varices.  Portal vein was patent.  CT of the chest showed bibasilar atelectasis.  There is a small amount of  mucoid debris in the distal trachea.  The patient was admitted for further evaluation and treatment of his encephalopathy. On the morning of 03/18/22, he wanted to leave against medical advice despite my prior discussions with him regarding the risks, benefits and alternatives.  He did not want to wait for me to speak with him and sign the AMA paper and left without providing me the opportunity to speak with him.

## 2022-03-17 NOTE — ED Notes (Signed)
Pt demanding to leave hospital , reports he needs to smoke a cigarette. Nicotine patch offered, pt refused, Diplomatic Services operational officer to page admitting MD, pt at this time willing to wait till contact with attending.

## 2022-03-17 NOTE — ED Notes (Signed)
Encouragement and support provided by this RN and pt wife. Pt now willing to stay in the hospital and willing to try nicotine patch. Attending Tat MD aware made aware of above, will continue to monitor.

## 2022-03-17 NOTE — ED Notes (Signed)
Patient transported to CT 

## 2022-03-17 NOTE — ED Notes (Signed)
Lactic acid 2.0. 

## 2022-03-17 NOTE — ED Notes (Signed)
Patient complaining of pain related to potassium infusion. Rate modified for comfort.

## 2022-03-17 NOTE — ED Notes (Signed)
Dietary called for tray

## 2022-03-17 NOTE — Assessment & Plan Note (Signed)
Patient has not drank in 1 week He is likely outside the window of withdrawal Start high-dose thiamine

## 2022-03-17 NOTE — Assessment & Plan Note (Signed)
12/31/2021 hemoglobin A1c 8.0 NovoLog sliding scale Holding long-acting insulin temporarily and monitor CBGs

## 2022-03-18 LAB — COMPREHENSIVE METABOLIC PANEL
ALT: 43 U/L (ref 0–44)
AST: 31 U/L (ref 15–41)
Albumin: 3.4 g/dL — ABNORMAL LOW (ref 3.5–5.0)
Alkaline Phosphatase: 55 U/L (ref 38–126)
Anion gap: 4 — ABNORMAL LOW (ref 5–15)
BUN: 17 mg/dL (ref 8–23)
CO2: 24 mmol/L (ref 22–32)
Calcium: 8.6 mg/dL — ABNORMAL LOW (ref 8.9–10.3)
Chloride: 113 mmol/L — ABNORMAL HIGH (ref 98–111)
Creatinine, Ser: 1.07 mg/dL (ref 0.61–1.24)
GFR, Estimated: >60 mL/min (ref >60)
Glucose, Bld: 158 mg/dL — ABNORMAL HIGH (ref 70–99)
Potassium: 4.1 mmol/L (ref 3.5–5.1)
Sodium: 141 mmol/L (ref 135–145)
Total Bilirubin: 0.6 mg/dL (ref 0.3–1.2)
Total Protein: 6.1 g/dL — ABNORMAL LOW (ref 6.5–8.1)

## 2022-03-18 LAB — CBC
HCT: 43.4 % (ref 39.0–52.0)
Hemoglobin: 14 g/dL (ref 13.0–17.0)
MCH: 29.4 pg (ref 26.0–34.0)
MCHC: 32.3 g/dL (ref 30.0–36.0)
MCV: 91.2 fL (ref 80.0–100.0)
Platelets: 82 10*3/uL — ABNORMAL LOW (ref 150–400)
RBC: 4.76 MIL/uL (ref 4.22–5.81)
RDW: 14.7 % (ref 11.5–15.5)
WBC: 6.8 10*3/uL (ref 4.0–10.5)
nRBC: 0 % (ref 0.0–0.2)

## 2022-03-18 LAB — GLUCOSE, CAPILLARY: Glucose-Capillary: 184 mg/dL — ABNORMAL HIGH (ref 70–99)

## 2022-03-18 NOTE — Progress Notes (Signed)
Patient states he wants AMA papers this morning upon entering his room. Papers signed. IV removed. MD informed via secure chat.

## 2022-03-18 NOTE — Discharge Summary (Addendum)
Physician Discharge Summary   Patient: Spencer Fletcher MRN: [id number] DOB: [birth year]  Admit date:     03/17/2022  Discharge date: 03/18/22  Discharge Physician: Shanon Brow Shaine Mount   PCP: Garwin Brothers, MD   PATIENT LEFT AGAINST MEDICAL ADVICE  Discharge Diagnoses: Principal Problem:   Acute metabolic encephalopathy Active Problems:   Uncontrolled type 2 diabetes mellitus with hyperglycemia (HCC)   Hepatic cirrhosis (HCC)   COPD mixed type ([place])   HLD (hyperlipidemia)   Obesity (BMI 30-39.9)   Alcohol abuse   Tobacco abuse   Opioid dependence ([place])   Diarrhea  Resolved Problems:   * No resolved hospital problems. 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  The patient's wife at the bedside supplements the history.  Apparently, the patient had been in his usual state of health until 03/16/2022 when he was feeling dizzy and noted himself to be hypoglycemic.  Apparently the patient had a number of falls on 03/16/2022.  Apparently his sugars were as low as 30-50.  He had to give himself 2 doses of glucagon and subsequently had to eat some candy before being able to get his sugars up into the 110 range.  Even after normalization of his sugars, the patient still had gait instability and postural instability and some confusion according to his wife.  He had difficulty even sitting on the edge of the bed without falling over.  His wife tried to convince him to go to the emergency department, but the patient refused.  When the patient woke up on the morning of 03/17/2022, he remained confused.  He continued to have postural instability and gait instability.  When he tried to stand up and sit up he will fall to 1 side on the bed.  His wife checked his CBG and it  was 117.  Patient's wife states that the patient had been in his usual state of health until 03/16/2022.  She states that he has been eating fine.  There is not been any new medications.  The patient is on numerous hypnotic and opioid medications which he self administers.  His wife states that he is usually vigilant about taking the proper dosing.  The patient himself denies any fever, chills, headache, chest pain, shortness of breath, coughing, vomiting, hematemesis, hematochezia, melena.  He has had generalized abdominal discomfort for the better part of the past 1 to 2 months.  He had a recent hospital admission to [facility] from 12/31/2021 to 01/02/2022 for abdominal pain and discomfort.  It was attributable to the patient's Ozempic which she has been told to discontinue.  The patient states that he has not used Ozempic for nearly 2 months now. Notably, the patient states that he has been having 5-6 loose stools on a daily basis.  He states that this has been going on for the last 2 to 3 weeks.  He was treated with Augmentin in the first week of July for a dental infection.  He denies any hematochezia or melena. Patient states that he took his usual dose of Toujeo 50 units on the morning of 03/17/2022. In the ED, the patient was initially obtunded.  He was afebrile and hemodynamically stable albeit with soft blood pressures.  Oxygen saturation was 100% on room air.  BMP showed sodium 137, potassium 3.0,  bicarbonate 22, serum creatinine 1.16.  AST 43, ALT 53, alk phosphatase 65, total bilirubin 0.9.  Ammonia 33.  Magnesium 2.3.  WBC 8.1, hemoglobin 15.5, platelets 110,000.  Lactic acid was initially 2.0>> 1.7.  EKG shows sinus rhythm and nonspecific T wave changes.  The patient was given a potassium IV 10 mEq.  He was given normal saline bolus and started on maintenance fluid.  CT of the abdomen and pelvis did not show any bowel obstruction or bowel thickening.  It showed a nodular liver with  suggestion of gastric varices.  Portal vein was patent.  CT of the chest showed bibasilar atelectasis.  There is a small amount of mucoid debris in the distal trachea.  The patient was admitted for further evaluation and treatment of his encephalopathy. On the morning of 03/18/22, he wanted to leave against medical advice despite my prior discussions with him regarding the risks, benefits and alternatives.  He did not want to wait for me to speak with him and sign the AMA paper and left without providing me the opportunity to speak with him.  Assessment and Plan: * Acute metabolic encephalopathy Multifactorial including polypharmacy, hypoglycemia, volume depletion and possibly hypotension and a component of alcohol withdrawal with Warnicke's encephalopathy Ammonia 33 Check TSH Check B12 Check thiamine level Start high-dose thiamine UA negative for pyuria CT brain negative No focal neurologic deficits on exam  Diarrhea Check C. Difficile Stool pathogen panel  Opioid dependence ([place]) PDMP reviewed MS Contin 15 mg #60--last refill 03/07/2022 Pregabalin 200 mg, #90--last refill 03/02/2022 Clonazepam 0.5 mg, #90--last refill 02/23/2022  Tobacco abuse Tobacco cessation discussed NicoDerm patch  Alcohol abuse Patient has not drank in 1 week He is likely outside the window of withdrawal Start high-dose thiamine  Obesity (BMI 30-39.9) BMI 33.23 Lifestyle modification  HLD (hyperlipidemia) Continue Crestor  COPD mixed type (HCC) Stable on room air  Hepatic cirrhosis ([place]) Appears clinically compensated Hold inderal due to soft BPs  Uncontrolled type 2 diabetes mellitus with hyperglycemia ([place]) 12/31/2021 hemoglobin A1c 8.0 NovoLog sliding scale Holding long-acting insulin temporarily and monitor CBGs         Consultants: NONE Procedures performed: NONE  Disposition: AGAINST MEDICAL ADVICE Diet recommendation:  AGAINST MEDICAL ADVICE DISCHARGE MEDICATION: Allergies as  of 03/18/2022       Reactions   Latex Rash   Ambien [zolpidem] Other (See Comments)   Carafate [sucralfate] Hives, Itching   Lexapro [escitalopram Oxalate] Nausea Only        Medication List     ASK your doctor about these medications    cetirizine 10 MG tablet Commonly known as: ZYRTEC Take 10 mg by mouth daily.   clonazePAM 0.5 MG tablet Commonly known as: KLONOPIN Take 0.5 mg by mouth 3 (three) times daily.   clopidogrel 75 MG tablet Commonly known as: PLAVIX Take 75 mg by mouth daily.   FERROUS SULFATE PO Take 1 tablet by mouth at bedtime.   folic acid 1 MG tablet Commonly known as: FOLVITE Take 1 mg by mouth daily.   furosemide 20 MG tablet Commonly known as: LASIX Take 20 mg by mouth daily.   Glucagon Emergency 1 MG Kit Inject 1 mL into the muscle daily as needed (low blood sugar).   hyoscyamine 0.125 MG SL tablet Commonly known as: LEVSIN SL Place 1 tablet (0.125 mg total) under the tongue every 6 (six) hours as needed for cramping.   insulin lispro 100 UNIT/ML KwikPen Commonly known as: HumaLOG KwikPen Inject 50 Units  into the skin daily with breakfast.   metoCLOPramide 10 MG tablet Commonly known as: REGLAN Take 10 mg by mouth in the morning, at noon, and at bedtime.   morphine 15 MG 12 hr tablet Commonly known as: MS CONTIN Take 15 mg by mouth 2 (two) times daily as needed.   Narcan 4 MG/0.1ML Liqd nasal spray kit Generic drug: naloxone Place 1 spray into the nose as needed (accidental overdose.).   pantoprazole 40 MG tablet Commonly known as: PROTONIX Take 40 mg by mouth 2 (two) times daily.   pregabalin 200 MG capsule Commonly known as: LYRICA Take 200 mg by mouth in the morning, at noon, and at bedtime.   propranolol 10 MG tablet Commonly known as: INDERAL TAKE 1 TABLET BY MOUTH THREE TIMES A DAY   rosuvastatin 40 MG tablet Commonly known as: CRESTOR Take 40 mg by mouth at bedtime.   Toujeo Max SoloStar 300 UNIT/ML Solostar  Pen Generic drug: insulin glargine (2 Unit Dial) Inject 80 Units into the skin every morning. And pen needles 2/day   triamcinolone cream 0.1 % Commonly known as: KENALOG 1 application. daily as needed (rash).        Discharge Exam: Filed Weights   03/17/22 0851  Weight: 102.1 kg    Condition at discharge: [place]  The results of significant diagnostics from this hospitalization (including imaging, microbiology, ancillary and laboratory) are listed below for reference.   Imaging Studies: CT CHEST ABDOMEN PELVIS W CONTRAST  Result Date: 03/17/2022 CLINICAL DATA:  [place] poly trauma Frequent falls Altered mental status Hypoglycemia Dizziness Malaise Generalized pain EXAM: CT CHEST, ABDOMEN, AND PELVIS WITH CONTRAST TECHNIQUE: Multidetector CT imaging of the chest, abdomen and pelvis was performed following the standard protocol during bolus administration of intravenous contrast. RADIATION DOSE REDUCTION: This exam was performed according to the departmental dose-optimization program which includes automated exposure control, adjustment of the mA and/or kV according to patient size and/or use of iterative reconstruction technique. CONTRAST:  172m OMNIPAQUE IOHEXOL 300 MG/ML  SOLN COMPARISON:  CT abdomen 12/31/2021 FINDINGS: CT CHEST FINDINGS Cardiovascular: No significant vascular findings. Normal heart size. No pericardial effusion. Coronary artery calcifications are present. Mediastinum/Nodes: No enlarged mediastinal, hilar, or axillary lymph nodes. Mild mediastinal lipomatosis is present. Lungs/Pleura: Mild dependent atelectasis seen bilaterally. Small amount of mucoid debris present within the distal trachea. Musculoskeletal: No chest wall mass or suspicious bone lesions identified. CT ABDOMEN PELVIS FINDINGS Hepatobiliary: Nodular hepatic contours consistent with cirrhosis. No focal hepatic lesion. Status post cholecystectomy. Portal vein is patent. Varices noted the  region of the duodenum and pancreatic head. Varices also seen along the posterior aspect of the stomach. Small splenorenal shunt is present. Pancreas: Unremarkable. Spleen: Unremarkable. Adrenals/Urinary Tract: Subcentimeter left renal hypodensity is too small to fully characterize. Adrenal glands, ureters, kidneys, and bladder otherwise normal. Stomach/Bowel: Metallic densities seen along the posterior aspect of the gastric cardia likely related to prior treatment of varix. No bowel dilatation to indicate ileus or obstruction. Mild colonic diverticulosis without definitive evidence of acute diverticulitis. Mild pericolonic fat stranding seen along side both the ascending and descending colon without focal colonic wall thickening. The appendix is not identified. Vascular/Lymphatic: No enlarged abdominal or pelvic lymph nodes are identified. Bilateral common iliac artery stents appear patent without significant stenosis. Atherosclerotic calcified plaque seen throughout the abdominal aorta. Reproductive: Prostate is unremarkable. Other: No abdominal wall hernia or abnormality. No abdominopelvic ascites. Musculoskeletal: No acute osseous abnormalities. Posterior fusion changes present at L3-L5. Neurostimulator leads  extend to the lower thoracic spinal canal. IMPRESSION: 1. No acute abnormality of the abdomen or pelvis. 2. Colonic diverticulosis without definitive evidence of acute diverticulitis. 3. Cirrhotic liver morphology with evidence of portal hypertension in the form of gastric and peripancreatic varices and splenorenal shunt. Electronically Signed   By: Miachel Roux M.D.   On: 03/17/2022 11:53   CT HEAD WO CONTRAST  Result Date: 03/17/2022 CLINICAL DATA:  Altered mental status, frequent falls EXAM: CT HEAD WITHOUT CONTRAST TECHNIQUE: Contiguous axial images were obtained from the base of the skull through the vertex without intravenous contrast. RADIATION DOSE REDUCTION: This exam was performed according to  the departmental dose-optimization program which includes automated exposure control, adjustment of the mA and/or kV according to patient size and/or use of iterative reconstruction technique. COMPARISON:  None Available. FINDINGS: Brain: No evidence of acute infarction, hemorrhage, mass, mass effect, or midline shift. No hydrocephalus or extra-axial fluid collection. Vascular: No hyperdense vessel. Atherosclerotic calcifications in the intracranial carotid and vertebral arteries. Skull: Normal. Negative for fracture or focal lesion. Sinuses/Orbits: Minimal mucosal thickening in the maxillary sinuses. The orbits are unremarkable. Other: None. IMPRESSION: No acute intracranial process. Electronically Signed   By: Merilyn Baba M.D.   On: 03/17/2022 11:28   DG Chest Port 1 View  Result Date: 03/17/2022 CLINICAL DATA:  Trauma, fall, hyperglycemia, dizziness, not feeling well, generalized pain, history diabetes mellitus, essential hypertension, cirrhosis EXAM: PORTABLE CHEST 1 VIEW COMPARISON:  Portable exam 0905 hours without priors for comparison FINDINGS: Upper normal heart size. Mediastinal contours and pulmonary vascularity normal. Atherosclerotic calcification aorta. Mild RIGHT basilar atelectasis. No definite acute infiltrate, pleural effusion, or pneumothorax. Osseous structures unremarkable. Atherosclerotic calcifications at the carotid bifurcations bilaterally. IMPRESSION: Mild RIGHT basilar atelectasis. Aortic Atherosclerosis (ICD10-I70.0). Electronically Signed   By: Lavonia Dana M.D.   On: 03/17/2022 09:15    Microbiology: Results for orders placed or performed during the hospital encounter of 03/17/22  C Difficile Quick Screen w PCR reflex     Status: None   Collection Time: 03/17/22  2:05 PM   Specimen: Stool  Result Value Ref Range Status   C Diff antigen NEGATIVE NEGATIVE Final   C Diff toxin NEGATIVE NEGATIVE Final   C Diff interpretation No C. difficile detected.  Final    Comment:  Performed at [facility], [address] [place], [place] [address]  Gastrointestinal Panel by PCR , Stool     Status: Abnormal   Collection Time: 03/17/22  2:26 PM   Specimen: Stool  Result Value Ref Range Status   Campylobacter species NOT DETECTED NOT DETECTED Final   Plesimonas shigelloides NOT DETECTED NOT DETECTED Final   Salmonella species NOT DETECTED NOT DETECTED Final   Yersinia enterocolitica NOT DETECTED NOT DETECTED Final   Vibrio species NOT DETECTED NOT DETECTED Final   Vibrio cholerae NOT DETECTED NOT DETECTED Final   Enteroaggregative E coli (EAEC) NOT DETECTED NOT DETECTED Final   Enteropathogenic E coli (EPEC) DETECTED (A) NOT DETECTED Final   Enterotoxigenic E coli (ETEC) DETECTED (A) NOT DETECTED Final   Shiga like toxin producing E coli (STEC) NOT DETECTED NOT DETECTED Final   Shigella/Enteroinvasive E coli (EIEC) DETECTED (A) NOT DETECTED Final   Cryptosporidium NOT DETECTED NOT DETECTED Final   Cyclospora cayetanensis NOT DETECTED NOT DETECTED Final   Entamoeba histolytica NOT DETECTED NOT DETECTED Final   Giardia lamblia NOT DETECTED NOT DETECTED Final   Adenovirus F40/41 NOT DETECTED NOT DETECTED Final   Astrovirus NOT DETECTED NOT  DETECTED Final   Norovirus GI/GII DETECTED (A) NOT DETECTED Final   Rotavirus A NOT DETECTED NOT DETECTED Final   Sapovirus (I, II, IV, and V) NOT DETECTED NOT DETECTED Final    Comment: Performed at [facility], [address] [place], [place] [address]  Culture, blood (Routine X 2) w Reflex to ID Panel     Status: None (Preliminary result)   Collection Time: 03/17/22  2:35 PM   Specimen: Left Antecubital; Blood  Result Value Ref Range Status   Specimen Description   Final    LEFT ANTECUBITAL BOTTLES DRAWN AEROBIC AND ANAEROBIC   Special Requests   Final    Blood Culture results may not be optimal due to an excessive volume of blood received in culture bottles   Culture   Final    NO GROWTH < 12 HOURS Performed at  [facility], [address] [place], [place] [address]    Report Status PENDING  Incomplete  Culture, blood (Routine X 2) w Reflex to ID Panel     Status: None (Preliminary result)   Collection Time: 03/17/22  2:37 PM   Specimen: BLOOD RIGHT HAND  Result Value Ref Range Status   Specimen Description BLOOD RIGHT HAND BOTTLES DRAWN AEROBIC ONLY  Final   Special Requests   Final    Blood Culture results may not be optimal due to an excessive volume of blood received in culture bottles   Culture   Final    NO GROWTH < 12 HOURS Performed at [facility], [address], [place],  [address]    Report Status PENDING  Incomplete    Labs: CBC: Recent Labs  Lab 03/17/22 0858 03/18/22 0527  WBC 8.1 6.8  NEUTROABS 5.6  --   HGB 15.5 14.0  HCT 47.3 43.4  MCV 88.9 91.2  PLT 110* 82*   Basic Metabolic Panel: Recent Labs  Lab 03/17/22 0858 03/18/22 0527  NA 137 141  K 3.0* 4.1  CL 106 113*  CO2 22 24  GLUCOSE 212* 158*  BUN 19 17  CREATININE 1.16 1.07  CALCIUM 9.1 8.6*  MG 2.3  --    Liver Function Tests: Recent Labs  Lab 03/17/22 0858 03/18/22 0527  AST 43* 31  ALT 53* 43  ALKPHOS 65 55  BILITOT 0.9 0.6  PROT 7.2 6.1*  ALBUMIN 4.0 3.4*   CBG: Recent Labs  Lab 03/17/22 0842 03/17/22 1630 03/17/22 2035 03/18/22 0730  GLUCAP 226* 145* 296* 184*    Discharge time spent: less than 30 minutes.  Signed: Orson Eva, MD [facility] 03/18/2022

## 2022-03-20 LAB — GASTROINTESTINAL PANEL BY PCR, STOOL (REPLACES STOOL CULTURE)
Adenovirus F40/41: NOT DETECTED
Astrovirus: NOT DETECTED
Campylobacter species: NOT DETECTED
Cryptosporidium: NOT DETECTED
Cyclospora cayetanensis: NOT DETECTED
Entamoeba histolytica: NOT DETECTED
Enteroaggregative E coli (EAEC): NOT DETECTED
Enteropathogenic E coli (EPEC): DETECTED — AB
Enterotoxigenic E coli (ETEC): DETECTED — AB
Giardia lamblia: NOT DETECTED
Norovirus GI/GII: DETECTED — AB
Plesimonas shigelloides: NOT DETECTED
Rotavirus A: NOT DETECTED
Salmonella species: NOT DETECTED
Sapovirus (I, II, IV, and V): NOT DETECTED
Shiga like toxin producing E coli (STEC): NOT DETECTED
Shigella/Enteroinvasive E coli (EIEC): DETECTED — AB
Vibrio cholerae: NOT DETECTED
Vibrio species: NOT DETECTED
Yersinia enterocolitica: NOT DETECTED

## 2022-03-22 DIAGNOSIS — R42 Dizziness and giddiness: Secondary | ICD-10-CM | POA: Diagnosis not present

## 2022-03-22 DIAGNOSIS — I10 Essential (primary) hypertension: Secondary | ICD-10-CM | POA: Diagnosis not present

## 2022-03-22 DIAGNOSIS — F419 Anxiety disorder, unspecified: Secondary | ICD-10-CM | POA: Diagnosis not present

## 2022-03-22 DIAGNOSIS — Z79899 Other long term (current) drug therapy: Secondary | ICD-10-CM | POA: Diagnosis not present

## 2022-03-22 DIAGNOSIS — M545 Low back pain, unspecified: Secondary | ICD-10-CM | POA: Diagnosis not present

## 2022-03-22 DIAGNOSIS — E1140 Type 2 diabetes mellitus with diabetic neuropathy, unspecified: Secondary | ICD-10-CM | POA: Diagnosis not present

## 2022-03-22 DIAGNOSIS — F338 Other recurrent depressive disorders: Secondary | ICD-10-CM | POA: Diagnosis not present

## 2022-03-22 DIAGNOSIS — F1120 Opioid dependence, uncomplicated: Secondary | ICD-10-CM | POA: Diagnosis not present

## 2022-03-22 DIAGNOSIS — J9690 Respiratory failure, unspecified, unspecified whether with hypoxia or hypercapnia: Secondary | ICD-10-CM | POA: Diagnosis not present

## 2022-03-22 DIAGNOSIS — M961 Postlaminectomy syndrome, not elsewhere classified: Secondary | ICD-10-CM | POA: Diagnosis not present

## 2022-03-22 DIAGNOSIS — G894 Chronic pain syndrome: Secondary | ICD-10-CM | POA: Diagnosis not present

## 2022-03-22 DIAGNOSIS — E119 Type 2 diabetes mellitus without complications: Secondary | ICD-10-CM | POA: Diagnosis not present

## 2022-03-22 DIAGNOSIS — E162 Hypoglycemia, unspecified: Secondary | ICD-10-CM | POA: Diagnosis not present

## 2022-03-22 LAB — CULTURE, BLOOD (ROUTINE X 2)
Culture: NO GROWTH
Culture: NO GROWTH

## 2022-03-24 DIAGNOSIS — F1020 Alcohol dependence, uncomplicated: Secondary | ICD-10-CM | POA: Diagnosis not present

## 2022-03-24 DIAGNOSIS — F17200 Nicotine dependence, unspecified, uncomplicated: Secondary | ICD-10-CM | POA: Diagnosis not present

## 2022-03-29 DIAGNOSIS — E1122 Type 2 diabetes mellitus with diabetic chronic kidney disease: Secondary | ICD-10-CM | POA: Diagnosis not present

## 2022-03-29 DIAGNOSIS — R531 Weakness: Secondary | ICD-10-CM | POA: Diagnosis not present

## 2022-03-29 DIAGNOSIS — E876 Hypokalemia: Secondary | ICD-10-CM | POA: Diagnosis not present

## 2022-03-29 DIAGNOSIS — I639 Cerebral infarction, unspecified: Secondary | ICD-10-CM | POA: Diagnosis not present

## 2022-03-29 DIAGNOSIS — E441 Mild protein-calorie malnutrition: Secondary | ICD-10-CM | POA: Diagnosis not present

## 2022-03-29 DIAGNOSIS — N1831 Chronic kidney disease, stage 3a: Secondary | ICD-10-CM | POA: Diagnosis not present

## 2022-03-29 DIAGNOSIS — E7220 Disorder of urea cycle metabolism, unspecified: Secondary | ICD-10-CM | POA: Diagnosis not present

## 2022-03-29 DIAGNOSIS — G9341 Metabolic encephalopathy: Secondary | ICD-10-CM | POA: Diagnosis not present

## 2022-03-29 DIAGNOSIS — J449 Chronic obstructive pulmonary disease, unspecified: Secondary | ICD-10-CM | POA: Diagnosis not present

## 2022-03-29 DIAGNOSIS — R55 Syncope and collapse: Secondary | ICD-10-CM | POA: Diagnosis not present

## 2022-03-29 DIAGNOSIS — R4182 Altered mental status, unspecified: Secondary | ICD-10-CM | POA: Diagnosis not present

## 2022-03-29 DIAGNOSIS — G8324 Monoplegia of upper limb affecting left nondominant side: Secondary | ICD-10-CM | POA: Diagnosis not present

## 2022-03-29 DIAGNOSIS — E1165 Type 2 diabetes mellitus with hyperglycemia: Secondary | ICD-10-CM | POA: Diagnosis not present

## 2022-03-29 DIAGNOSIS — I129 Hypertensive chronic kidney disease with stage 1 through stage 4 chronic kidney disease, or unspecified chronic kidney disease: Secondary | ICD-10-CM | POA: Diagnosis not present

## 2022-03-29 DIAGNOSIS — E0865 Diabetes mellitus due to underlying condition with hyperglycemia: Secondary | ICD-10-CM | POA: Diagnosis not present

## 2022-03-29 DIAGNOSIS — I63233 Cerebral infarction due to unspecified occlusion or stenosis of bilateral carotid arteries: Secondary | ICD-10-CM | POA: Diagnosis not present

## 2022-03-29 DIAGNOSIS — N189 Chronic kidney disease, unspecified: Secondary | ICD-10-CM | POA: Diagnosis not present

## 2022-03-30 DIAGNOSIS — G40909 Epilepsy, unspecified, not intractable, without status epilepticus: Secondary | ICD-10-CM | POA: Diagnosis not present

## 2022-03-30 DIAGNOSIS — I340 Nonrheumatic mitral (valve) insufficiency: Secondary | ICD-10-CM | POA: Diagnosis not present

## 2022-03-30 DIAGNOSIS — I5189 Other ill-defined heart diseases: Secondary | ICD-10-CM | POA: Diagnosis not present

## 2022-03-30 DIAGNOSIS — R55 Syncope and collapse: Secondary | ICD-10-CM | POA: Diagnosis not present

## 2022-03-30 DIAGNOSIS — I361 Nonrheumatic tricuspid (valve) insufficiency: Secondary | ICD-10-CM | POA: Diagnosis not present

## 2022-03-30 DIAGNOSIS — I272 Pulmonary hypertension, unspecified: Secondary | ICD-10-CM | POA: Diagnosis not present

## 2022-03-30 DIAGNOSIS — M545 Low back pain, unspecified: Secondary | ICD-10-CM | POA: Diagnosis not present

## 2022-03-30 DIAGNOSIS — R413 Other amnesia: Secondary | ICD-10-CM | POA: Diagnosis not present

## 2022-03-30 DIAGNOSIS — G9341 Metabolic encephalopathy: Secondary | ICD-10-CM | POA: Diagnosis not present

## 2022-03-31 DIAGNOSIS — M545 Low back pain, unspecified: Secondary | ICD-10-CM | POA: Diagnosis not present

## 2022-03-31 DIAGNOSIS — G9341 Metabolic encephalopathy: Secondary | ICD-10-CM | POA: Diagnosis not present

## 2022-03-31 DIAGNOSIS — G40909 Epilepsy, unspecified, not intractable, without status epilepticus: Secondary | ICD-10-CM | POA: Diagnosis not present

## 2022-03-31 DIAGNOSIS — R413 Other amnesia: Secondary | ICD-10-CM | POA: Diagnosis not present

## 2022-03-31 DIAGNOSIS — R55 Syncope and collapse: Secondary | ICD-10-CM | POA: Diagnosis not present

## 2022-04-13 DIAGNOSIS — Z91119 Patient's noncompliance with dietary regimen due to unspecified reason: Secondary | ICD-10-CM | POA: Diagnosis not present

## 2022-04-13 DIAGNOSIS — R739 Hyperglycemia, unspecified: Secondary | ICD-10-CM | POA: Diagnosis not present

## 2022-04-13 DIAGNOSIS — Z91148 Patient's other noncompliance with medication regimen for other reason: Secondary | ICD-10-CM | POA: Diagnosis not present

## 2022-04-13 DIAGNOSIS — R079 Chest pain, unspecified: Secondary | ICD-10-CM | POA: Diagnosis not present

## 2022-04-13 DIAGNOSIS — J811 Chronic pulmonary edema: Secondary | ICD-10-CM | POA: Diagnosis not present

## 2022-04-13 DIAGNOSIS — J449 Chronic obstructive pulmonary disease, unspecified: Secondary | ICD-10-CM | POA: Diagnosis not present

## 2022-04-13 DIAGNOSIS — G8929 Other chronic pain: Secondary | ICD-10-CM | POA: Diagnosis not present

## 2022-04-13 DIAGNOSIS — I110 Hypertensive heart disease with heart failure: Secondary | ICD-10-CM | POA: Diagnosis not present

## 2022-04-13 DIAGNOSIS — I639 Cerebral infarction, unspecified: Secondary | ICD-10-CM | POA: Diagnosis not present

## 2022-04-13 DIAGNOSIS — T40601A Poisoning by unspecified narcotics, accidental (unintentional), initial encounter: Secondary | ICD-10-CM | POA: Diagnosis not present

## 2022-04-13 DIAGNOSIS — M6281 Muscle weakness (generalized): Secondary | ICD-10-CM | POA: Diagnosis not present

## 2022-04-13 DIAGNOSIS — I509 Heart failure, unspecified: Secondary | ICD-10-CM | POA: Diagnosis not present

## 2022-04-18 DIAGNOSIS — R42 Dizziness and giddiness: Secondary | ICD-10-CM | POA: Diagnosis not present

## 2022-04-18 DIAGNOSIS — R569 Unspecified convulsions: Secondary | ICD-10-CM | POA: Diagnosis not present

## 2022-04-18 DIAGNOSIS — Z794 Long term (current) use of insulin: Secondary | ICD-10-CM | POA: Diagnosis not present

## 2022-04-18 DIAGNOSIS — E1165 Type 2 diabetes mellitus with hyperglycemia: Secondary | ICD-10-CM | POA: Diagnosis not present

## 2022-04-18 DIAGNOSIS — R55 Syncope and collapse: Secondary | ICD-10-CM | POA: Diagnosis not present

## 2022-04-29 DIAGNOSIS — J449 Chronic obstructive pulmonary disease, unspecified: Secondary | ICD-10-CM | POA: Diagnosis not present

## 2022-05-16 ENCOUNTER — Ambulatory Visit: Payer: Medicare PPO | Admitting: Neurology

## 2022-05-17 ENCOUNTER — Telehealth: Payer: Self-pay | Admitting: Physician Assistant

## 2022-05-17 MED ORDER — PANTOPRAZOLE SODIUM 40 MG PO TBEC: 40.0000 mg | DELAYED_RELEASE_TABLET | Freq: Two times a day (BID) | ORAL | 5 refills | Status: AC

## 2022-05-17 NOTE — Telephone Encounter (Signed)
Inbound call from patient stating he needs a refill for PROTONIX. Please advise.

## 2022-05-17 NOTE — Telephone Encounter (Signed)
I called Spencer Fletcher to confirm the pharmacy and refilled his pantoprazole as requested to [facility].

## 2022-05-22 ENCOUNTER — Other Ambulatory Visit: Payer: Self-pay | Admitting: Gastroenterology

## 2022-05-22 ENCOUNTER — Telehealth: Payer: Self-pay

## 2022-05-22 DIAGNOSIS — J9690 Respiratory failure, unspecified, unspecified whether with hypoxia or hypercapnia: Secondary | ICD-10-CM | POA: Diagnosis not present

## 2022-05-22 DIAGNOSIS — M545 Low back pain, unspecified: Secondary | ICD-10-CM | POA: Diagnosis not present

## 2022-05-22 DIAGNOSIS — Z79899 Other long term (current) drug therapy: Secondary | ICD-10-CM | POA: Diagnosis not present

## 2022-05-22 DIAGNOSIS — I10 Essential (primary) hypertension: Secondary | ICD-10-CM | POA: Diagnosis not present

## 2022-05-22 DIAGNOSIS — F419 Anxiety disorder, unspecified: Secondary | ICD-10-CM | POA: Diagnosis not present

## 2022-05-22 DIAGNOSIS — F338 Other recurrent depressive disorders: Secondary | ICD-10-CM | POA: Diagnosis not present

## 2022-05-22 DIAGNOSIS — E119 Type 2 diabetes mellitus without complications: Secondary | ICD-10-CM | POA: Diagnosis not present

## 2022-05-22 DIAGNOSIS — M961 Postlaminectomy syndrome, not elsewhere classified: Secondary | ICD-10-CM | POA: Diagnosis not present

## 2022-05-22 DIAGNOSIS — G894 Chronic pain syndrome: Secondary | ICD-10-CM | POA: Diagnosis not present

## 2022-05-22 DIAGNOSIS — F1120 Opioid dependence, uncomplicated: Secondary | ICD-10-CM | POA: Diagnosis not present

## 2022-05-22 NOTE — Telephone Encounter (Signed)
Dr Tarri Glenn,  I am sending you a Rx request for a pt of Dr Ardis Hughs as you are DOD am.  Please advise

## 2022-05-22 NOTE — Telephone Encounter (Signed)
I called the [facility] in [place] to check on his pantoprazole refill. They had it on hold, his insurance said it's too early to fill. Estill Bamberg the pharmacist said to tell him to come to the [facility] and she will try to give him some pantoprazole to get him thru to 10/14th when insurance will cover it. I called and told Juanda Crumble. He said thank you.

## 2022-05-26 DIAGNOSIS — E119 Type 2 diabetes mellitus without complications: Secondary | ICD-10-CM | POA: Diagnosis not present

## 2022-05-29 DIAGNOSIS — J449 Chronic obstructive pulmonary disease, unspecified: Secondary | ICD-10-CM | POA: Diagnosis not present

## 2022-05-31 DIAGNOSIS — E1142 Type 2 diabetes mellitus with diabetic polyneuropathy: Secondary | ICD-10-CM | POA: Diagnosis not present

## 2022-05-31 DIAGNOSIS — Z794 Long term (current) use of insulin: Secondary | ICD-10-CM | POA: Diagnosis not present

## 2022-05-31 DIAGNOSIS — E782 Mixed hyperlipidemia: Secondary | ICD-10-CM | POA: Diagnosis not present

## 2022-05-31 DIAGNOSIS — F515 Nightmare disorder: Secondary | ICD-10-CM | POA: Diagnosis not present

## 2022-06-06 ENCOUNTER — Encounter: Payer: Self-pay | Admitting: Neurology

## 2022-06-06 ENCOUNTER — Ambulatory Visit: Payer: Medicare PPO | Admitting: Neurology

## 2022-06-06 VITALS — BP 112/65 | HR 67 | Ht 69.0 in | Wt 213.0 lb

## 2022-06-06 DIAGNOSIS — G8929 Other chronic pain: Secondary | ICD-10-CM

## 2022-06-06 DIAGNOSIS — E1140 Type 2 diabetes mellitus with diabetic neuropathy, unspecified: Secondary | ICD-10-CM

## 2022-06-06 DIAGNOSIS — Z794 Long term (current) use of insulin: Secondary | ICD-10-CM | POA: Diagnosis not present

## 2022-06-06 DIAGNOSIS — R269 Unspecified abnormalities of gait and mobility: Secondary | ICD-10-CM

## 2022-06-06 MED ORDER — SCOPOLAMINE 1 MG/3DAYS TD PT72: 1.0000 | MEDICATED_PATCH | TRANSDERMAL | 0 refills | Status: AC

## 2022-06-06 NOTE — Progress Notes (Signed)
[facility]  PATIENT: Spencer Fletcher DOB: Apr 21, 1961  REQUESTING CLINICIAN: Garwin Brothers, MD HISTORY FROM: Patient  REASON FOR VISIT: Dizziness    HISTORICAL  CHIEF COMPLAINT:  Chief Complaint  Patient presents with   New Patient (Initial Visit)    RM 12, ALONE NP/Paper/[facility] Delorise Shiner MD [phone], seizures vs TIA's Today c/o dizziness and left sided weakness    HISTORY OF PRESENT ILLNESS:  This is a 61 year old gentleman with past medical history of hypertension, hyperlipidemia, diabetes mellitus, chronic pain and peripheral neuropathy who is presenting with complaint of ongoing dizziness.  Patient reports dizziness started about couple months ago.  He described it as a funny feeling, feeling unsteady, trouble walking and weakness on the left side.  Due to the symptoms he presented to the ED, he reported passing out in the ED, CT scan was at that time was negative for any acute stroke and he was told that he had a TIA.  He is on aspirin and Plavix.  Patient reports couple weeks later he went back to the ED because of his symptoms were not improving.  Again in the ED he passed out again and was told that he had a seizure.  He was put on Keppra but had side effect of irritability and anger and then self discontinued the Keppra 3 weeks ago.  Since discontinuing the Keppra, although side effect subsided and he has not had any additional events. The dizziness is not described as room spinning sensation, no fall, no nausea or vomiting associated with the dizziness. He is also complaining of new onset headache bilateral temporal, he does take Motrin for headaches.  He denies any pain with palpation, denies any pain with chewing. He has chronic pain syndrome, he is on Clonazepam, Morphine and Lyrica.     OTHER MEDICAL CONDITIONS: Diabetes Mellitus, Chronic pain, Peripheral neuropathy, hypertension, hyperlipidemia, CAD    REVIEW OF SYSTEMS:  Full 14 system review of systems performed and negative with exception of: As noted in the HPI  ALLERGIES: Allergies  Allergen Reactions   Latex Rash   Ambien [Zolpidem] Other (See Comments)   Carafate [Sucralfate] Hives and Itching   Lexapro [Escitalopram Oxalate] Nausea Only    HOME MEDICATIONS: Outpatient Medications Prior to Visit  Medication Sig Dispense Refill   cetirizine (ZYRTEC) 10 MG tablet Take 10 mg by mouth daily.     clonazePAM (KLONOPIN) 0.5 MG tablet Take 0.5 mg by mouth 3 (three) times daily.     clopidogrel (PLAVIX) 75 MG tablet Take 75 mg by mouth daily.     FERROUS SULFATE PO Take 1 tablet by mouth at bedtime.     folic acid (FOLVITE) 1 MG tablet Take 1 mg by mouth daily.     furosemide (LASIX) 20 MG tablet Take 20 mg by mouth daily.     Glucagon, rDNA, (GLUCAGON EMERGENCY) 1 MG KIT Inject 1 mL into the muscle daily as needed (low blood sugar).     hyoscyamine (LEVSIN SL) 0.125 MG SL tablet Place 1 tablet (0.125 mg total) under the tongue every 6 (six) hours as needed for cramping. (Patient not taking: Reported on 03/17/2022) 30 tablet 0   insulin glargine, 2 Unit Dial, (TOUJEO MAX SOLOSTAR) 300 UNIT/ML Solostar Pen Inject 80 Units into the skin every morning. And pen needles 2/day (Patient taking differently: Inject 50 Units into the skin every morning. And pen needles 2/day) 30 mL 3   insulin lispro (HUMALOG KWIKPEN) 100 UNIT/ML KwikPen Inject  50 Units into the skin daily with breakfast. (Patient taking differently: Inject 50 Units into the skin See admin instructions. Sliding scale) 60 mL 3   metoCLOPramide (REGLAN) 10 MG tablet Take 10 mg by mouth in the morning, at noon, and at bedtime.     morphine (MS CONTIN) 15 MG 12 hr tablet Take 15 mg by mouth 2 (two) times daily as needed.     naloxone (NARCAN) 4 MG/0.1ML LIQD nasal spray kit Place 1 spray into the nose as needed (accidental overdose.).      pantoprazole (PROTONIX) 40 MG tablet Take 1 tablet (40 mg total) by  mouth 2 (two) times daily. 60 tablet 5   pregabalin (LYRICA) 200 MG capsule Take 200 mg by mouth in the morning, at noon, and at bedtime.      propranolol (INDERAL) 10 MG tablet TAKE 1 TABLET BY MOUTH THREE TIMES DAILY 270 tablet 0   rosuvastatin (CRESTOR) 40 MG tablet Take 40 mg by mouth at bedtime.     triamcinolone cream (KENALOG) 0.1 % 1 application. daily as needed (rash).     No facility-administered medications prior to visit.    PAST MEDICAL HISTORY: Past Medical History:  Diagnosis Date   Anxiety    Ascites    Chronic pain syndrome    CKD (chronic kidney disease), stage III (HCC)    COPD (chronic obstructive pulmonary disease) (HCC)    Diabetes mellitus without complication (HCC)    Essential hypertension    GERD (gastroesophageal reflux disease)    Heart attack ([place])    Hyperlipidemia    Insomnia    Sleep apnea     PAST SURGICAL HISTORY: Past Surgical History:  Procedure Laterality Date   ABDOMINAL AORTIC ENDOVASCULAR STENT GRAFT  2023   APPENDECTOMY     BACK SURGERY     BIOPSY  12/18/2019   Procedure: BIOPSY;  Surgeon: Milus Banister, MD;  Location: [facility];  Service: Endoscopy;;   CARDIAC CATHETERIZATION     CHOLECYSTECTOMY     COLONOSCOPY WITH PROPOFOL N/A 12/18/2019   Procedure: COLONOSCOPY WITH PROPOFOL;  Surgeon: Milus Banister, MD;  Location: [facility];  Service: Endoscopy;  Laterality: N/A;   ESOPHAGOGASTRODUODENOSCOPY (EGD) WITH PROPOFOL N/A 12/18/2019   Procedure: ESOPHAGOGASTRODUODENOSCOPY (EGD) WITH PROPOFOL;  Surgeon: Milus Banister, MD;  Location: [facility];  Service: Endoscopy;  Laterality: N/A;    FAMILY HISTORY: Family History  Problem Relation Age of Onset   Breast cancer Mother    Diabetes Father    Heart disease Father    Lung disease Father    Heart disease Sister    Hypertension Sister    Lung disease Sister    Heart disease Brother     SOCIAL HISTORY: Social History   Socioeconomic History   Marital status:  Married    Spouse name: Not on file   Number of children: Not on file   Years of education: Not on file   Highest education level: Not on file  Occupational History   Not on file  Tobacco Use   Smoking status: Every Day    Types: Cigarettes    Last attempt to quit: 08/22/2019    Years since quitting: 2.7   Smokeless tobacco: Never  Vaping Use   Vaping Use: Some days   Substances: Flavoring   Devices: once every 2 weeks  Substance and Sexual Activity   Alcohol use: Yes    Comment: Last drink 6 days ago.   Drug use: Not  Currently   Sexual activity: Not on file  Other Topics Concern   Not on file  Social History Narrative   Not on file   Social Determinants of Health   Financial Resource Strain: Not on file  Food Insecurity: Not on file  Transportation Needs: Not on file  Physical Activity: Not on file  Stress: Not on file  Social Connections: Not on file  Intimate Partner Violence: Not on file    PHYSICAL EXAM  GENERAL EXAM/CONSTITUTIONAL: Vitals:  Vitals:   06/06/22 0813  BP: 112/65  Pulse: 67  Weight: 213 lb (96.6 kg)  Height: 5' 9"  (1.753 m)   Body mass index is 31.45 kg/m. Wt Readings from Last 3 Encounters:  06/06/22 213 lb (96.6 kg)  03/17/22 225 lb (102.1 kg)  12/31/21 212 lb (96.2 kg)   Patient is in no distress; well developed, nourished and groomed; neck is supple  EYES: Pupils round and reactive to light, Visual fields full to confrontation, Extraocular movements intacts,   MUSCULOSKELETAL: Gait, strength, tone, movements noted in Neurologic exam below  NEUROLOGIC: MENTAL STATUS:      No data to display         awake, alert, oriented to person, place and time recent and remote memory intact normal attention and concentration language fluent, comprehension intact, naming intact fund of knowledge appropriate  CRANIAL NERVE:  2nd, 3rd, 4th, 6th - pupils equal and reactive to light, visual fields full to confrontation, extraocular  muscles intact, no nystagmus 5th - facial sensation symmetric 7th - facial strength symmetric 8th - hearing intact 9th - palate elevates symmetrically, uvula midline 11th - shoulder shrug symmetric 12th - tongue protrusion midline  MOTOR:  normal bulk and tone, full strength in the BUE, BLE  SENSORY:  Decrease sensation to light touch up to ankle, pinprick and vibration up to knee. There is loss of proprioception in the BLE  COORDINATION:  finger-nose-finger, fine finger movements normal  REFLEXES:  deep tendon reflexes present and symmetric  GAIT/STATION:  Wide based gait, unable to tandem and positive Romberg.    DIAGNOSTIC DATA (LABS, IMAGING, TESTING) - I reviewed patient records, labs, notes, testing and imaging myself where available.  Lab Results  Component Value Date   WBC 6.8 03/18/2022   HGB 14.0 03/18/2022   HCT 43.4 03/18/2022   MCV 91.2 03/18/2022   PLT 82 (L) 03/18/2022      Component Value Date/Time   NA 141 03/18/2022 0527   K 4.1 03/18/2022 0527   CL 113 (H) 03/18/2022 0527   CO2 24 03/18/2022 0527   GLUCOSE 158 (H) 03/18/2022 0527   BUN 17 03/18/2022 0527   CREATININE 1.07 03/18/2022 0527   CALCIUM 8.6 (L) 03/18/2022 0527   PROT 6.1 (L) 03/18/2022 0527   ALBUMIN 3.4 (L) 03/18/2022 0527   AST 31 03/18/2022 0527   ALT 43 03/18/2022 0527   ALKPHOS 55 03/18/2022 0527   BILITOT 0.6 03/18/2022 0527   GFRNONAA >60 03/18/2022 0527   Lab Results  Component Value Date   CHOL 81 12/31/2021   HDL 27 (L) 12/31/2021   LDLCALC 21 12/31/2021   TRIG 163 (H) 12/31/2021   CHOLHDL 3.0 12/31/2021   Lab Results  Component Value Date   HGBA1C 8.0 (H) 12/31/2021   Lab Results  Component Value Date   EBRAXENM07 680 03/17/2022   Lab Results  Component Value Date   TSH 1.882 03/17/2022    CT scan reported with no acute abnormality.  He is unable to obtain a MRI due having a spinal stimulator with a dead battery.     ASSESSMENT AND PLAN  61  y.o. year old male with history of hypertension, hyperlipidemia, CAD, diabetes mellitus, chronic pain, peripheral neuropathy who is presenting with ongoing dizziness for the past couple months.  Dizziness described as being unsteady and wobbly.  On exam he does have evidence of peripheral neuropathy all the way to the knees.  He does also have history of chronic pain and is taking morphine, Lyrica, and Klonopin.  I have informed patient that the dizziness is likely multifactorial due to his peripheral neuropathy, with a combination of multiple sedative medications.  I did advise him to increase his physical activity, and follow-up with pain management and primary care doctor.  For the dizziness we will try scopolamine patch. He is unable to obtain an MRI and he CT head was reported as normal.  In term of the possible seizure, will continue to monitor, if he has another event, will consider repeating EEG and starting him on ASM.    1. Gait abnormality   2. Type 2 diabetes mellitus with diabetic neuropathy, with long-term current use of insulin (HCC)   3. Other chronic pain      Patient Instructions  Trial of Scopolamine  Increase water intake  Increase physical activity  Continue to follow up with PCP and pain management Please contact us if you have another syncopal event, at that time, will repeat EEG and consider ASM. Return as needed   No orders of the defined types were placed in this encounter.   Meds ordered this encounter  Medications   scopolamine (TRANSDERM-SCOP) 1 MG/3DAYS    Sig: Place 1 patch (1.5 mg total) onto the skin every 3 (three) days.    Dispense:  10 patch    Refill:  0    Return if symptoms worsen or fail to improve.  I have spent a total of 45 minutes dedicated to this patient today, preparing to see patient, performing a medically appropriate examination and evaluation, ordering tests and/or medications and procedures, and counseling and educating the  patient/family/caregiver; independently interpreting result and communicating results to the family/patient/caregiver; and documenting clinical information in the electronic medical record.   Alric Ran, MD 06/06/2022, 9:14 AM  [facility] [address] [place], [place] [address] [phone]

## 2022-06-06 NOTE — Patient Instructions (Addendum)
Trial of Scopolamine  Increase water intake  Increase physical activity  Continue to follow up with PCP and pain management Please contact us if you have another syncopal event, at that time, will repeat EEG and consider ASM. Return as needed

## 2022-06-07 ENCOUNTER — Telehealth: Payer: Self-pay | Admitting: Neurology

## 2022-06-07 NOTE — Telephone Encounter (Signed)
I called pt and updated on MD's message. Advised at this time we will not try and new medications.   Pt verbalized understanding and appreciation for the call.

## 2022-06-07 NOTE — Telephone Encounter (Signed)
Pt is having a reaction to the scopolamine (TRANSDERM-SCOP) 1 MG/3DAYS that was prescribed to him and would like to discuss with RN or provider. Please advise.

## 2022-06-07 NOTE — Telephone Encounter (Signed)
I called the pt. He sts he started the scopolamine patch last night around 7 pm and at 345 am this morning he woke up with severe dizziness. Pt reports sx so severe this morning he could not stand up at times.  He feels like sx are related to the patch and would like to d/c. He sts he is going to remove the patch today and wanted to see if MD had any other recommendations?

## 2022-06-07 NOTE — Telephone Encounter (Signed)
Please advise patient to discontinue patch. Thanks

## 2022-06-11 DIAGNOSIS — Z794 Long term (current) use of insulin: Secondary | ICD-10-CM | POA: Diagnosis not present

## 2022-06-11 DIAGNOSIS — E118 Type 2 diabetes mellitus with unspecified complications: Secondary | ICD-10-CM | POA: Diagnosis not present

## 2022-06-21 DEATH — deceased

## 2022-06-29 DIAGNOSIS — J449 Chronic obstructive pulmonary disease, unspecified: Secondary | ICD-10-CM | POA: Diagnosis not present

## 2023-04-02 IMAGING — US US ABDOMEN LIMITED RUQ/ASCITES
1 series · 14 of 25 positions shown · non-contrast
Comparison: MR abdomen 12/17/2019.

CLINICAL DATA: Cirrhosis due to alcoholic liver disease.

EXAM:
ULTRASOUND ABDOMEN LIMITED RIGHT UPPER QUADRANT

[Series 1: us abdomen limited ruq/ascites · 14 of 52 slices shown]
[im 1/52]
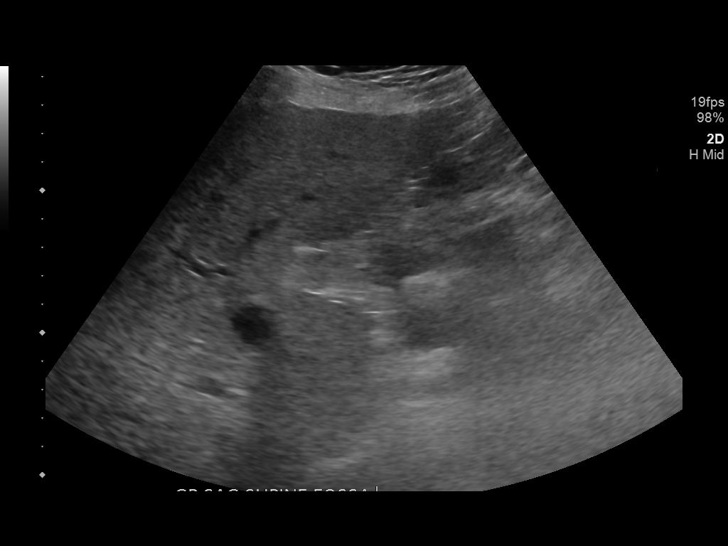
[im 5/52]
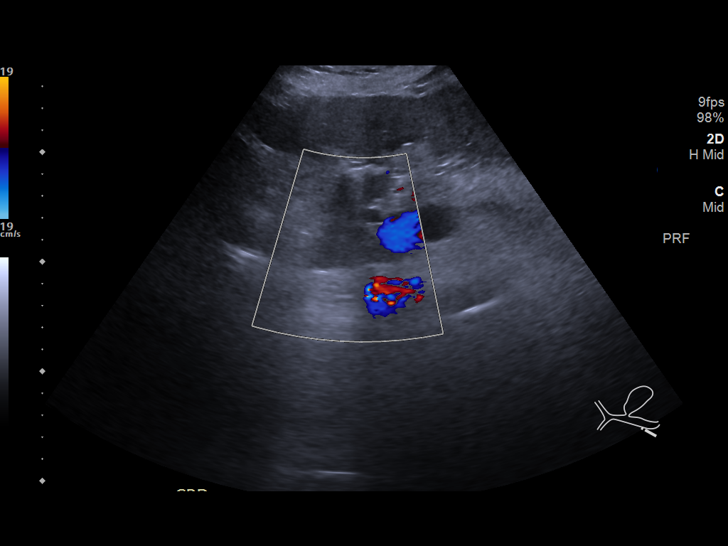
[im 9/52]
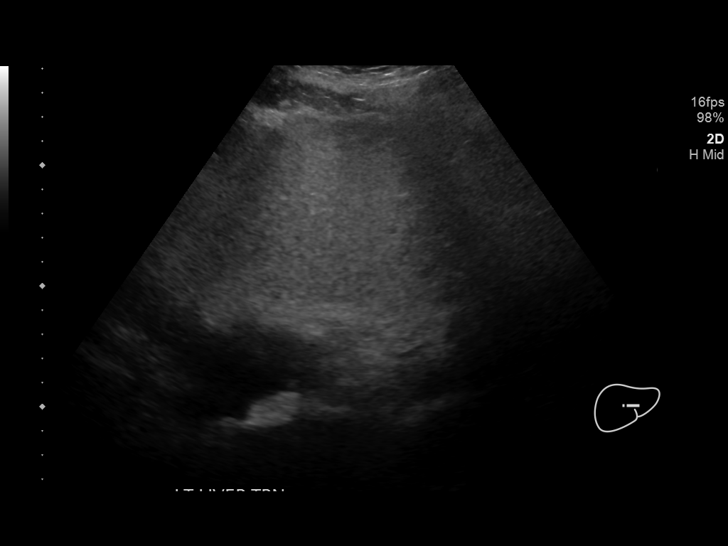
[im 13/52]
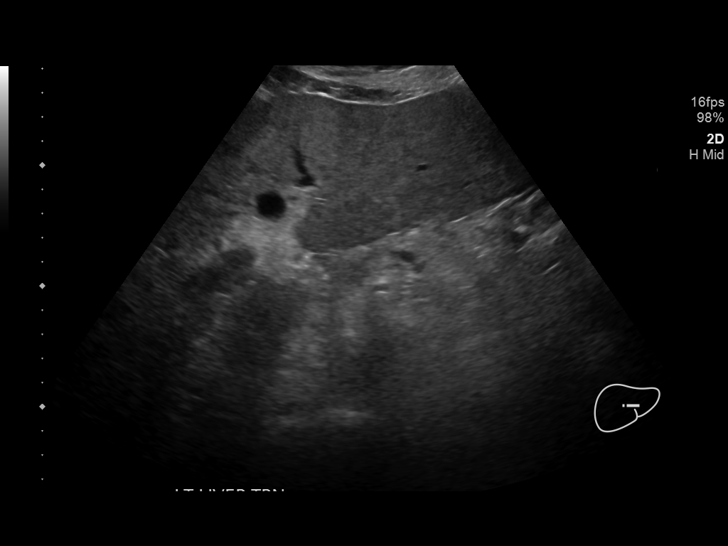
[im 18/52]
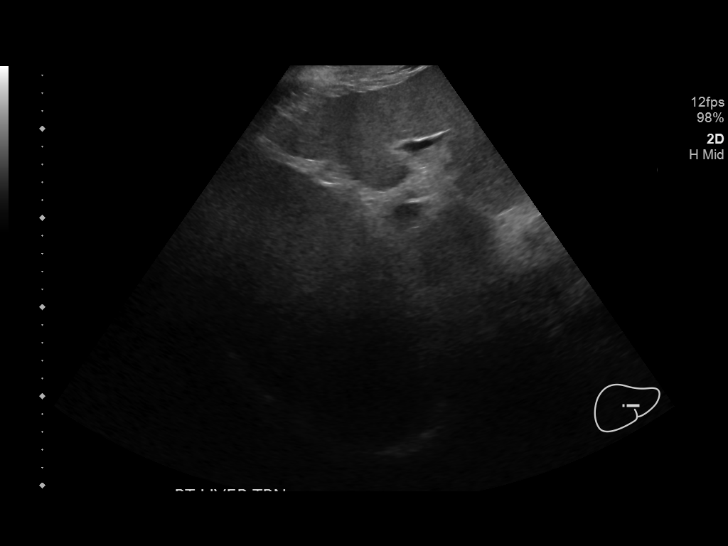
[im 20/52]
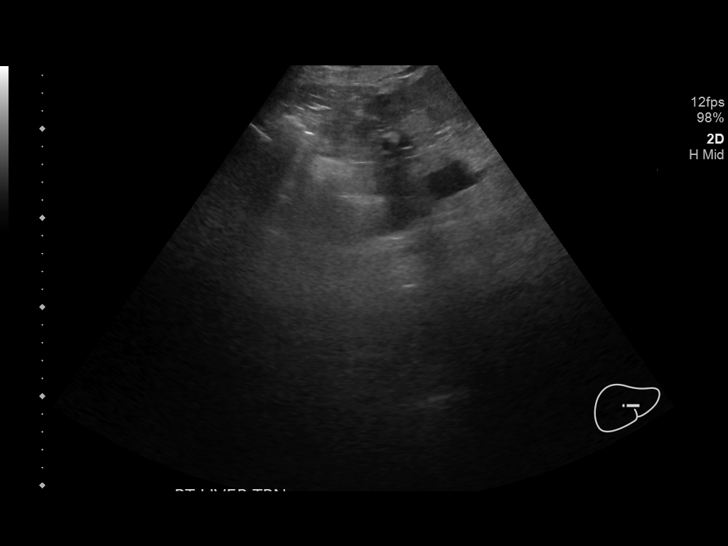
[im 24/52]
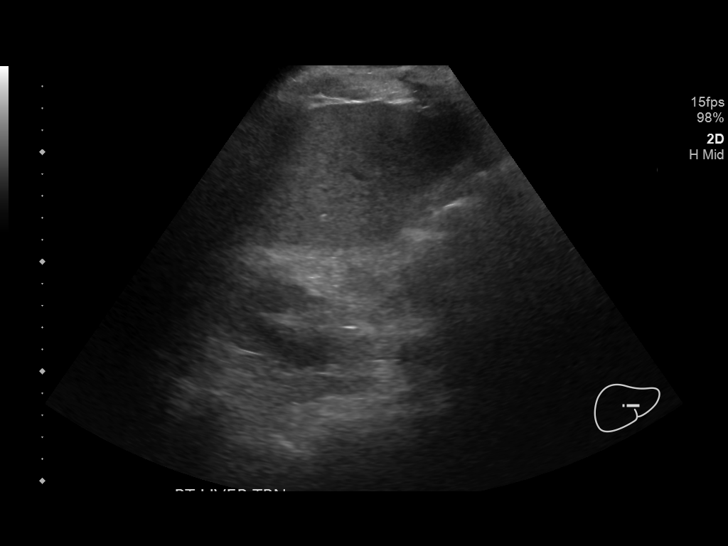
[im 28/52]
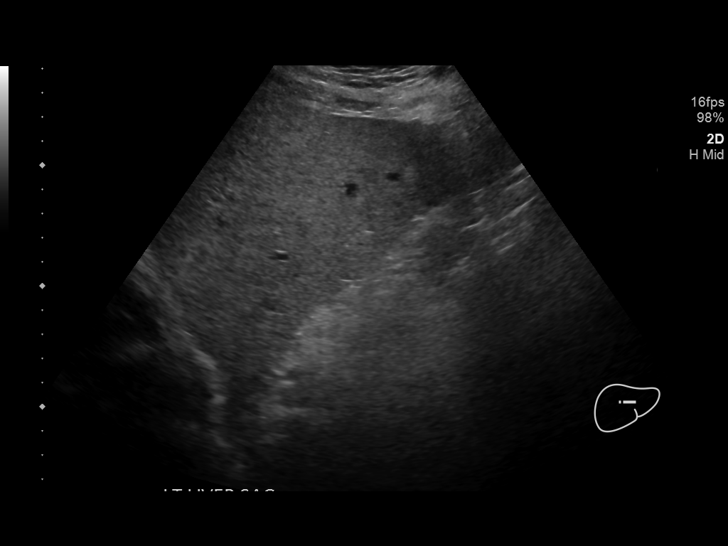
[im 32/52]
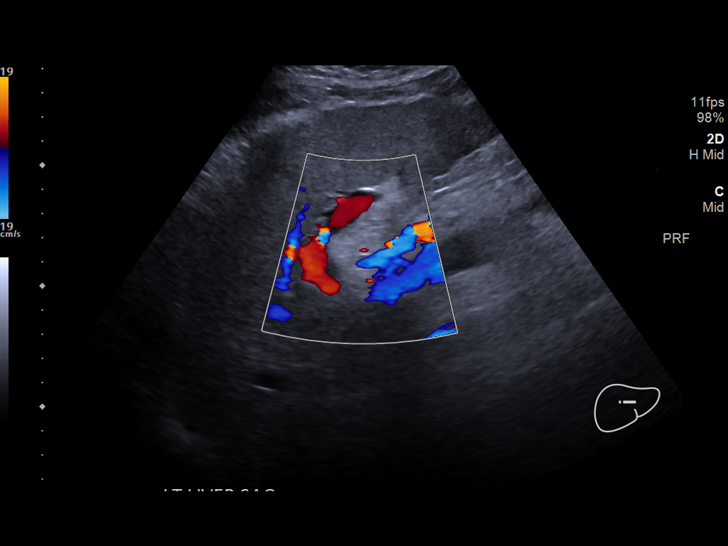
[im 35/52]
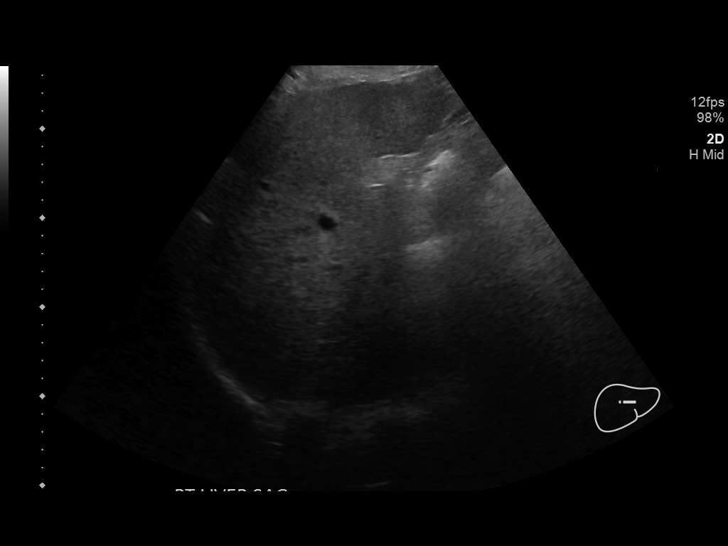
[im 39/52]
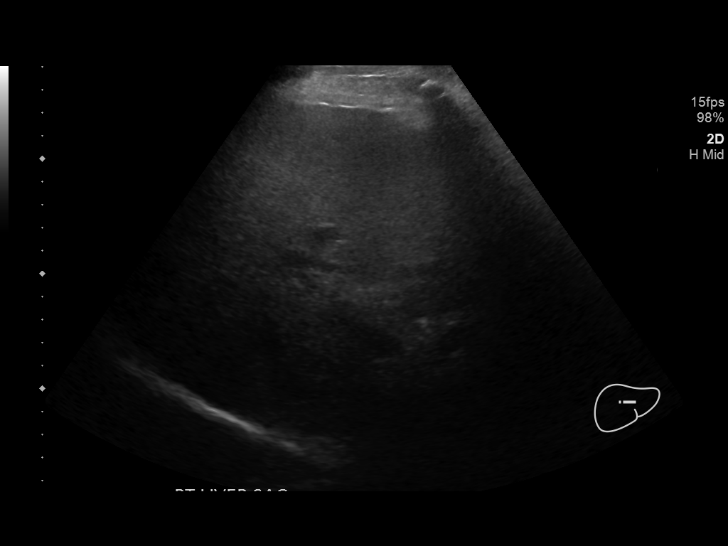
[im 43/52]
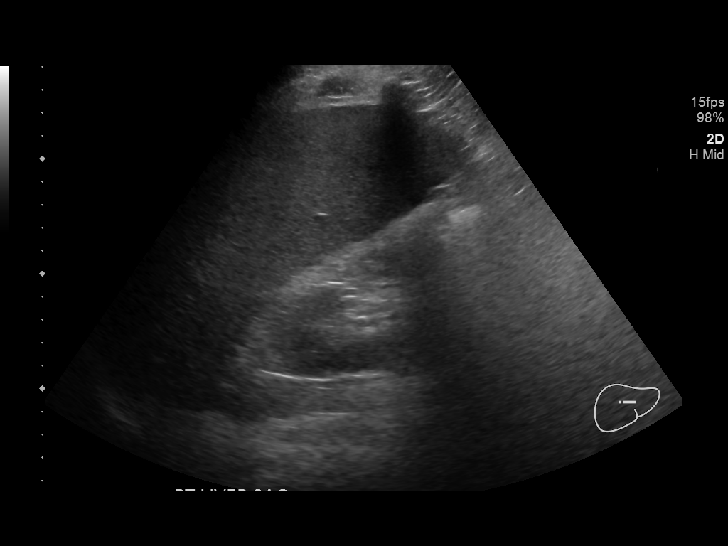
[im 47/52]
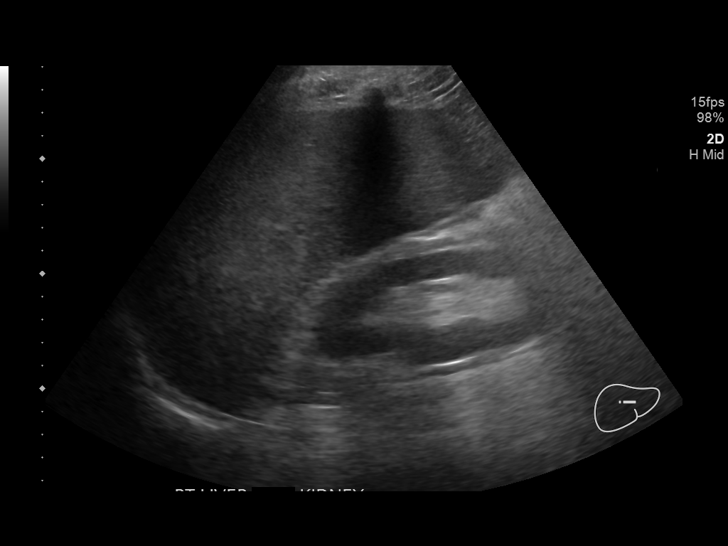
[im 52/52]
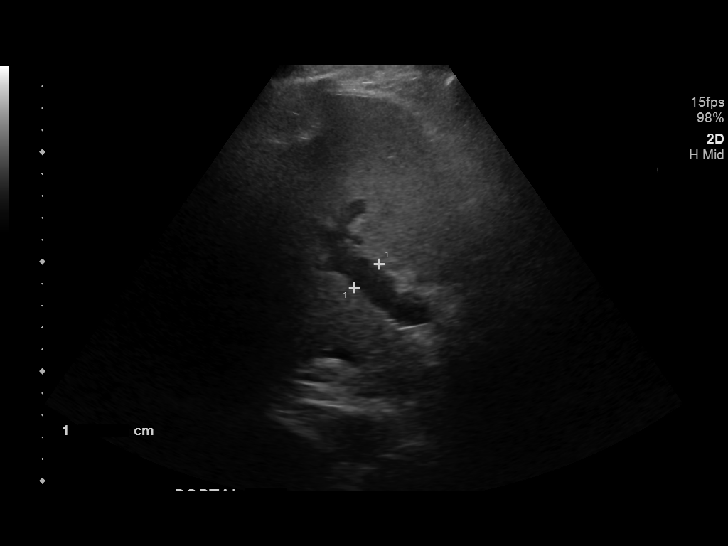

[14 of 25 positions shown; findings below may reference images not displayed]

FINDINGS: Gallbladder:

Status post cholecystectomy.

Common bile duct:

Diameter: 9 mm

Liver:

Enlarged liver measuring up to at least 22 cm. Nodular hepatic
contour. No focal lesion identified. Heterogeneous parenchymal
echogenicity. Portal vein is patent on color Doppler imaging with
normal direction of blood flow towards the liver.

Other: Recanalized paraumbilical vein identified.
IMPRESSION: Hepatomegaly and hepatic cirrhosis with portal hypertension. Limited
evaluation for focal hepatic lesion. Consider MRI liver protocol
further evaluation.
